# Patient Record
Sex: Female | Born: 1965 | Race: Black or African American | Hispanic: No | Marital: Single | Smoking: Never smoker
Health system: Southern US, Community
[De-identification: ages and names within clinical notes are randomized; demographics above are authoritative.]

## PROBLEM LIST (undated history)

## (undated) DIAGNOSIS — F32A Depression, unspecified: Secondary | ICD-10-CM

## (undated) DIAGNOSIS — M5030 Other cervical disc degeneration, unspecified cervical region: Secondary | ICD-10-CM

## (undated) DIAGNOSIS — G43909 Migraine, unspecified, not intractable, without status migrainosus: Secondary | ICD-10-CM

## (undated) DIAGNOSIS — I499 Cardiac arrhythmia, unspecified: Secondary | ICD-10-CM

## (undated) DIAGNOSIS — G4730 Sleep apnea, unspecified: Secondary | ICD-10-CM

## (undated) DIAGNOSIS — I10 Essential (primary) hypertension: Secondary | ICD-10-CM

## (undated) DIAGNOSIS — F329 Major depressive disorder, single episode, unspecified: Secondary | ICD-10-CM

## (undated) HISTORY — PX: ABDOMINAL HYSTERECTOMY: SHX81

## (undated) HISTORY — PX: VAGINAL HYSTERECTOMY: SHX2639

---

## 2005-04-07 ENCOUNTER — Emergency Department: Payer: Self-pay | Admitting: Emergency Medicine

## 2005-08-17 ENCOUNTER — Emergency Department: Payer: Self-pay | Admitting: Emergency Medicine

## 2006-09-28 ENCOUNTER — Emergency Department: Payer: Self-pay | Admitting: General Practice

## 2007-07-22 ENCOUNTER — Emergency Department: Payer: Self-pay | Admitting: Emergency Medicine

## 2008-01-25 ENCOUNTER — Ambulatory Visit: Payer: Self-pay | Admitting: Family Medicine

## 2009-02-21 ENCOUNTER — Emergency Department: Payer: Self-pay | Admitting: Emergency Medicine

## 2009-07-09 ENCOUNTER — Encounter: Payer: Self-pay | Admitting: Unknown Physician Specialty

## 2009-07-20 ENCOUNTER — Encounter: Payer: Self-pay | Admitting: Unknown Physician Specialty

## 2009-08-20 ENCOUNTER — Encounter: Payer: Self-pay | Admitting: Unknown Physician Specialty

## 2010-01-17 ENCOUNTER — Observation Stay: Payer: Self-pay | Admitting: *Deleted

## 2011-04-29 ENCOUNTER — Ambulatory Visit: Payer: Self-pay

## 2011-07-10 ENCOUNTER — Emergency Department: Payer: Self-pay | Admitting: Internal Medicine

## 2012-03-20 ENCOUNTER — Emergency Department: Payer: Self-pay | Admitting: Emergency Medicine

## 2012-05-24 ENCOUNTER — Emergency Department: Payer: Self-pay | Admitting: Emergency Medicine

## 2012-05-24 LAB — CBC WITH DIFFERENTIAL/PLATELET
Basophil %: 0.7 %
Eosinophil %: 1.3 %
HCT: 35.8 % (ref 35.0–47.0)
Lymphocyte #: 2.8 10*3/uL (ref 1.0–3.6)
Lymphocyte %: 39.3 %
MCV: 81 fL (ref 80–100)
Monocyte #: 0.6 x10 3/mm (ref 0.2–0.9)
Monocyte %: 8.5 %
Platelet: 265 10*3/uL (ref 150–440)
RBC: 4.4 10*6/uL (ref 3.80–5.20)
RDW: 14.9 % — ABNORMAL HIGH (ref 11.5–14.5)
WBC: 7.3 10*3/uL (ref 3.6–11.0)

## 2012-05-24 LAB — URINALYSIS, COMPLETE
Bilirubin,UR: NEGATIVE
Blood: NEGATIVE
Leukocyte Esterase: NEGATIVE
Nitrite: NEGATIVE
Ph: 5 (ref 4.5–8.0)
Protein: NEGATIVE
Specific Gravity: 1.025 (ref 1.003–1.030)
Squamous Epithelial: <1

## 2012-05-24 LAB — COMPREHENSIVE METABOLIC PANEL
Anion Gap: 5 — ABNORMAL LOW (ref 7–16)
BUN: 20 mg/dL — ABNORMAL HIGH (ref 7–18)
Calcium, Total: 8.7 mg/dL (ref 8.5–10.1)
Chloride: 107 mmol/L (ref 98–107)
Co2: 28 mmol/L (ref 21–32)
Creatinine: 0.77 mg/dL (ref 0.60–1.30)
EGFR (African American): >60
EGFR (Non-African Amer.): >60
SGOT(AST): 24 U/L (ref 15–37)
SGPT (ALT): 25 U/L (ref 12–78)
Total Protein: 7.1 g/dL (ref 6.4–8.2)

## 2012-05-24 LAB — TROPONIN I: Troponin-I: <0.02 ng/mL

## 2012-09-12 ENCOUNTER — Emergency Department: Payer: Self-pay | Admitting: Emergency Medicine

## 2012-12-13 ENCOUNTER — Ambulatory Visit: Payer: Self-pay

## 2012-12-13 ENCOUNTER — Emergency Department: Payer: Self-pay | Admitting: Emergency Medicine

## 2012-12-13 LAB — BASIC METABOLIC PANEL
Co2: 27 mmol/L (ref 21–32)
Creatinine: 0.87 mg/dL (ref 0.60–1.30)
EGFR (African American): >60
Glucose: 91 mg/dL (ref 65–99)
Osmolality: 280 (ref 275–301)
Potassium: 3.9 mmol/L (ref 3.5–5.1)
Sodium: 139 mmol/L (ref 136–145)

## 2012-12-13 LAB — CBC
HGB: 11.2 g/dL — ABNORMAL LOW (ref 12.0–16.0)
MCH: 25.9 pg — ABNORMAL LOW (ref 26.0–34.0)
MCV: 83 fL (ref 80–100)
Platelet: 295 10*3/uL (ref 150–440)
RDW: 15.4 % — ABNORMAL HIGH (ref 11.5–14.5)

## 2012-12-13 LAB — CK TOTAL AND CKMB (NOT AT ARMC)
CK, Total: 190 U/L (ref 21–215)
CK-MB: 1.1 ng/mL (ref 0.5–3.6)

## 2013-11-06 ENCOUNTER — Emergency Department: Payer: Self-pay | Admitting: Emergency Medicine

## 2013-11-06 LAB — BASIC METABOLIC PANEL
ANION GAP: 6 — AB (ref 7–16)
BUN: 15 mg/dL (ref 7–18)
Calcium, Total: 9.5 mg/dL (ref 8.5–10.1)
Chloride: 107 mmol/L (ref 98–107)
Co2: 28 mmol/L (ref 21–32)
Creatinine: 0.69 mg/dL (ref 0.60–1.30)
EGFR (African American): >60
EGFR (Non-African Amer.): >60
GLUCOSE: 99 mg/dL (ref 65–99)
Osmolality: 282 (ref 275–301)
Potassium: 3.6 mmol/L (ref 3.5–5.1)
Sodium: 141 mmol/L (ref 136–145)

## 2013-11-06 LAB — CBC
HCT: 37.9 % (ref 35.0–47.0)
HGB: 12.3 g/dL (ref 12.0–16.0)
MCH: 26.9 pg (ref 26.0–34.0)
MCHC: 32.5 g/dL (ref 32.0–36.0)
MCV: 83 fL (ref 80–100)
PLATELETS: 299 10*3/uL (ref 150–440)
RBC: 4.58 10*6/uL (ref 3.80–5.20)
RDW: 14.8 % — ABNORMAL HIGH (ref 11.5–14.5)
WBC: 10.2 10*3/uL (ref 3.6–11.0)

## 2013-11-06 LAB — TROPONIN I

## 2014-01-06 ENCOUNTER — Emergency Department: Payer: Self-pay | Admitting: Emergency Medicine

## 2014-01-06 LAB — URINALYSIS, COMPLETE
BILIRUBIN, UR: NEGATIVE
GLUCOSE, UR: NEGATIVE mg/dL (ref 0–75)
KETONE: NEGATIVE
NITRITE: NEGATIVE
PH: 5 (ref 4.5–8.0)
Protein: NEGATIVE
RBC,UR: 33 /HPF (ref 0–5)
Specific Gravity: 1.023 (ref 1.003–1.030)
Squamous Epithelial: 6
WBC UR: 31 /HPF (ref 0–5)

## 2014-01-06 LAB — CBC
HCT: 36.9 % (ref 35.0–47.0)
HGB: 12.1 g/dL (ref 12.0–16.0)
MCH: 27.1 pg (ref 26.0–34.0)
MCHC: 32.8 g/dL (ref 32.0–36.0)
MCV: 83 fL (ref 80–100)
PLATELETS: 271 10*3/uL (ref 150–440)
RBC: 4.47 10*6/uL (ref 3.80–5.20)
RDW: 14.6 % — AB (ref 11.5–14.5)
WBC: 8.3 10*3/uL (ref 3.6–11.0)

## 2014-01-06 LAB — COMPREHENSIVE METABOLIC PANEL
ALK PHOS: 58 U/L
ALT: 24 U/L (ref 12–78)
AST: 20 U/L (ref 15–37)
Albumin: 3.7 g/dL (ref 3.4–5.0)
Anion Gap: 3 — ABNORMAL LOW (ref 7–16)
BUN: 21 mg/dL — AB (ref 7–18)
Bilirubin,Total: 0.2 mg/dL (ref 0.2–1.0)
CHLORIDE: 106 mmol/L (ref 98–107)
CREATININE: 0.73 mg/dL (ref 0.60–1.30)
Calcium, Total: 9.1 mg/dL (ref 8.5–10.1)
Co2: 29 mmol/L (ref 21–32)
EGFR (Non-African Amer.): >60
Glucose: 77 mg/dL (ref 65–99)
OSMOLALITY: 277 (ref 275–301)
Potassium: 4 mmol/L (ref 3.5–5.1)
SODIUM: 138 mmol/L (ref 136–145)
Total Protein: 7.5 g/dL (ref 6.4–8.2)

## 2014-01-06 LAB — GC/CHLAMYDIA PROBE AMP

## 2014-01-06 LAB — WET PREP, GENITAL

## 2014-01-08 LAB — URINE CULTURE

## 2014-03-15 ENCOUNTER — Emergency Department: Payer: Self-pay | Admitting: Emergency Medicine

## 2014-04-10 ENCOUNTER — Emergency Department: Payer: Self-pay | Admitting: Emergency Medicine

## 2014-04-10 LAB — COMPREHENSIVE METABOLIC PANEL
ALT: 41 U/L (ref 12–78)
Albumin: 3.3 g/dL — ABNORMAL LOW (ref 3.4–5.0)
Alkaline Phosphatase: 55 U/L
Anion Gap: 8 (ref 7–16)
BUN: 13 mg/dL (ref 7–18)
Bilirubin,Total: 0.4 mg/dL (ref 0.2–1.0)
CHLORIDE: 103 mmol/L (ref 98–107)
Calcium, Total: 9.1 mg/dL (ref 8.5–10.1)
Co2: 28 mmol/L (ref 21–32)
Creatinine: 0.89 mg/dL (ref 0.60–1.30)
Glucose: 159 mg/dL — ABNORMAL HIGH (ref 65–99)
Osmolality: 281 (ref 275–301)
POTASSIUM: 3.6 mmol/L (ref 3.5–5.1)
SGOT(AST): 23 U/L (ref 15–37)
SODIUM: 139 mmol/L (ref 136–145)
TOTAL PROTEIN: 6.7 g/dL (ref 6.4–8.2)

## 2014-04-10 LAB — CBC WITH DIFFERENTIAL/PLATELET
BASOS ABS: 0 10*3/uL (ref 0.0–0.1)
Basophil %: 0.4 %
EOS PCT: 0.9 %
Eosinophil #: 0.1 10*3/uL (ref 0.0–0.7)
HCT: 39.7 % (ref 35.0–47.0)
HGB: 12.6 g/dL (ref 12.0–16.0)
LYMPHS PCT: 32.5 %
Lymphocyte #: 1.9 10*3/uL (ref 1.0–3.6)
MCH: 25.8 pg — ABNORMAL LOW (ref 26.0–34.0)
MCHC: 31.7 g/dL — AB (ref 32.0–36.0)
MCV: 82 fL (ref 80–100)
Monocyte #: 0.7 x10 3/mm (ref 0.2–0.9)
Monocyte %: 11.7 %
Neutrophil #: 3.3 10*3/uL (ref 1.4–6.5)
Neutrophil %: 54.5 %
Platelet: 236 10*3/uL (ref 150–440)
RBC: 4.87 10*6/uL (ref 3.80–5.20)
RDW: 14.1 % (ref 11.5–14.5)
WBC: 6 10*3/uL (ref 3.6–11.0)

## 2014-04-10 LAB — LIPASE, BLOOD: Lipase: 111 U/L (ref 73–393)

## 2014-04-10 LAB — TROPONIN I: Troponin-I: <0.02 ng/mL

## 2014-04-15 ENCOUNTER — Emergency Department: Payer: Self-pay | Admitting: Emergency Medicine

## 2014-04-15 LAB — CBC
HCT: 37.1 % (ref 35.0–47.0)
HGB: 12 g/dL (ref 12.0–16.0)
MCH: 25.9 pg — ABNORMAL LOW (ref 26.0–34.0)
MCHC: 32.3 g/dL (ref 32.0–36.0)
MCV: 80 fL (ref 80–100)
PLATELETS: 215 10*3/uL (ref 150–440)
RBC: 4.62 10*6/uL (ref 3.80–5.20)
RDW: 14.2 % (ref 11.5–14.5)
WBC: 6.7 10*3/uL (ref 3.6–11.0)

## 2014-04-15 LAB — COMPREHENSIVE METABOLIC PANEL
ANION GAP: 6 — AB (ref 7–16)
AST: 48 U/L — AB (ref 15–37)
Albumin: 3.3 g/dL — ABNORMAL LOW (ref 3.4–5.0)
Alkaline Phosphatase: 60 U/L
BUN: 9 mg/dL (ref 7–18)
Bilirubin,Total: 0.3 mg/dL (ref 0.2–1.0)
Calcium, Total: 8.7 mg/dL (ref 8.5–10.1)
Chloride: 107 mmol/L (ref 98–107)
Co2: 26 mmol/L (ref 21–32)
Creatinine: 0.86 mg/dL (ref 0.60–1.30)
EGFR (African American): >60
EGFR (Non-African Amer.): >60
Glucose: 99 mg/dL (ref 65–99)
OSMOLALITY: 276 (ref 275–301)
POTASSIUM: 3.7 mmol/L (ref 3.5–5.1)
SGPT (ALT): 68 U/L (ref 12–78)
SODIUM: 139 mmol/L (ref 136–145)
Total Protein: 6.6 g/dL (ref 6.4–8.2)

## 2014-04-15 LAB — URINALYSIS, COMPLETE
BLOOD: NEGATIVE
Bacteria: NONE SEEN
Bilirubin,UR: NEGATIVE
Glucose,UR: NEGATIVE mg/dL (ref 0–75)
Ketone: NEGATIVE
LEUKOCYTE ESTERASE: NEGATIVE
NITRITE: NEGATIVE
PROTEIN: NEGATIVE
Ph: 7 (ref 4.5–8.0)
RBC,UR: 1 /HPF (ref 0–5)
Specific Gravity: 1.014 (ref 1.003–1.030)
Squamous Epithelial: <1
WBC UR: <1 /HPF (ref 0–5)

## 2014-04-15 LAB — TROPONIN I: Troponin-I: <0.02 ng/mL

## 2014-04-15 LAB — LIPASE, BLOOD: Lipase: 111 U/L (ref 73–393)

## 2014-10-26 ENCOUNTER — Emergency Department: Payer: Self-pay | Admitting: Emergency Medicine

## 2014-12-28 ENCOUNTER — Emergency Department: Payer: Self-pay | Admitting: Emergency Medicine

## 2015-03-27 ENCOUNTER — Emergency Department
Admission: EM | Admit: 2015-03-27 | Discharge: 2015-03-28 | Disposition: A | Discharge status: Home / Self Care | Payer: Managed Care, Other (non HMO) | Attending: Emergency Medicine | Admitting: Emergency Medicine

## 2015-03-27 DIAGNOSIS — Z79899 Other long term (current) drug therapy: Secondary | ICD-10-CM | POA: Insufficient documentation

## 2015-03-27 DIAGNOSIS — F4323 Adjustment disorder with mixed anxiety and depressed mood: Secondary | ICD-10-CM

## 2015-03-27 DIAGNOSIS — F329 Major depressive disorder, single episode, unspecified: Secondary | ICD-10-CM | POA: Insufficient documentation

## 2015-03-27 DIAGNOSIS — F439 Reaction to severe stress, unspecified: Secondary | ICD-10-CM | POA: Diagnosis present

## 2015-03-27 DIAGNOSIS — F4329 Adjustment disorder with other symptoms: Secondary | ICD-10-CM | POA: Insufficient documentation

## 2015-03-27 DIAGNOSIS — F330 Major depressive disorder, recurrent, mild: Secondary | ICD-10-CM

## 2015-03-27 DIAGNOSIS — M1990 Unspecified osteoarthritis, unspecified site: Secondary | ICD-10-CM

## 2015-03-27 DIAGNOSIS — F419 Anxiety disorder, unspecified: Secondary | ICD-10-CM | POA: Diagnosis not present

## 2015-03-27 DIAGNOSIS — I10 Essential (primary) hypertension: Secondary | ICD-10-CM | POA: Diagnosis not present

## 2015-03-27 DIAGNOSIS — F32A Depression, unspecified: Secondary | ICD-10-CM

## 2015-03-27 HISTORY — DX: Migraine, unspecified, not intractable, without status migrainosus: G43.909

## 2015-03-27 HISTORY — DX: Essential (primary) hypertension: I10

## 2015-03-27 HISTORY — DX: Depression, unspecified: F32.A

## 2015-03-27 HISTORY — DX: Major depressive disorder, single episode, unspecified: F32.9

## 2015-03-27 HISTORY — DX: Other cervical disc degeneration, unspecified cervical region: M50.30

## 2015-03-27 HISTORY — DX: Sleep apnea, unspecified: G47.30

## 2015-03-27 LAB — URINALYSIS COMPLETE WITH MICROSCOPIC (ARMC ONLY)
BILIRUBIN URINE: NEGATIVE
Bacteria, UA: NONE SEEN
Glucose, UA: NEGATIVE mg/dL
Hgb urine dipstick: NEGATIVE
Ketones, ur: NEGATIVE mg/dL
Leukocytes, UA: NEGATIVE
Nitrite: NEGATIVE
Protein, ur: NEGATIVE mg/dL
Specific Gravity, Urine: 1.019 (ref 1.005–1.030)
pH: 6 (ref 5.0–8.0)

## 2015-03-27 LAB — ETHANOL: Alcohol, Ethyl (B): <5 mg/dL (ref <5)

## 2015-03-27 LAB — URINE DRUG SCREEN, QUALITATIVE (ARMC ONLY)
Amphetamines, Ur Screen: NOT DETECTED
Barbiturates, Ur Screen: NOT DETECTED
Benzodiazepine, Ur Scrn: NOT DETECTED
CANNABINOID 50 NG, UR ~~LOC~~: NOT DETECTED
Cocaine Metabolite,Ur ~~LOC~~: NOT DETECTED
MDMA (Ecstasy)Ur Screen: NOT DETECTED
METHADONE SCREEN, URINE: NOT DETECTED
OPIATE, UR SCREEN: NOT DETECTED
PHENCYCLIDINE (PCP) UR S: NOT DETECTED
Tricyclic, Ur Screen: NOT DETECTED

## 2015-03-27 LAB — COMPREHENSIVE METABOLIC PANEL
ALK PHOS: 51 U/L (ref 38–126)
ALT: 19 U/L (ref 14–54)
ANION GAP: 6 (ref 5–15)
AST: 19 U/L (ref 15–41)
Albumin: 4.1 g/dL (ref 3.5–5.0)
BUN: 13 mg/dL (ref 6–20)
CALCIUM: 8.8 mg/dL — AB (ref 8.9–10.3)
CO2: 29 mmol/L (ref 22–32)
Chloride: 107 mmol/L (ref 101–111)
Creatinine, Ser: 0.79 mg/dL (ref 0.44–1.00)
GFR calc non Af Amer: >60 mL/min (ref >60)
Glucose, Bld: 96 mg/dL (ref 65–99)
Potassium: 3.5 mmol/L (ref 3.5–5.1)
SODIUM: 142 mmol/L (ref 135–145)
Total Bilirubin: 0.4 mg/dL (ref 0.3–1.2)
Total Protein: 7.3 g/dL (ref 6.5–8.1)

## 2015-03-27 LAB — CBC WITH DIFFERENTIAL/PLATELET
Basophils Absolute: 0 10*3/uL (ref 0–0.1)
Basophils Relative: 1 %
EOS PCT: 2 %
Eosinophils Absolute: 0.1 10*3/uL (ref 0–0.7)
HCT: 38.6 % (ref 35.0–47.0)
Hemoglobin: 12.3 g/dL (ref 12.0–16.0)
Lymphocytes Relative: 41 %
Lymphs Abs: 3.6 10*3/uL (ref 1.0–3.6)
MCH: 26.2 pg (ref 26.0–34.0)
MCHC: 31.8 g/dL — AB (ref 32.0–36.0)
MCV: 82.3 fL (ref 80.0–100.0)
Monocytes Absolute: 0.7 10*3/uL (ref 0.2–0.9)
Monocytes Relative: 8 %
NEUTROS ABS: 4.3 10*3/uL (ref 1.4–6.5)
NEUTROS PCT: 48 %
Platelets: 267 10*3/uL (ref 150–440)
RBC: 4.69 MIL/uL (ref 3.80–5.20)
RDW: 14.2 % (ref 11.5–14.5)
WBC: 8.7 10*3/uL (ref 3.6–11.0)

## 2015-03-27 LAB — SALICYLATE LEVEL: Salicylate Lvl: <4 mg/dL (ref 2.8–30.0)

## 2015-03-27 LAB — ACETAMINOPHEN LEVEL: Acetaminophen (Tylenol), Serum: <10 ug/mL — ABNORMAL LOW (ref 10–30)

## 2015-03-27 NOTE — ED Notes (Signed)
BEHAVIORAL HEALTH ROUNDING Patient sleeping: No. Patient alert and oriented: yes Behavior appropriate: Yes.  ; If no, describe:  Nutrition and fluids offered: Yes  Toileting and hygiene offered: Yes  Sitter present: yes Law enforcement present: Yes  

## 2015-03-27 NOTE — ED Notes (Signed)

## 2015-03-27 NOTE — ED Notes (Addendum)

## 2015-03-27 NOTE — ED Notes (Addendum)
BEHAVIORAL HEALTH ROUNDING Patient sleeping: No. Patient alert and oriented: yes Behavior appropriate: Yes.  ;  Nutrition and fluids offered: Yes  Toileting and hygiene offered: Yes  Sitter present: yes Law enforcement present: Yes  

## 2015-03-27 NOTE — ED Notes (Signed)
BEHAVIORAL HEALTH ROUNDING Patient sleeping: Yes.   Patient alert and oriented: yes Behavior appropriate: Yes.  ; If no, describe:  Nutrition and fluids offered: Yes  Toileting and hygiene offered: Yes  Sitter present: yes Law enforcement present: Yes  

## 2015-03-27 NOTE — ED Notes (Signed)
Patient states family issues are extremely stressful and needs help dealing with it.
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
## 2015-03-27 NOTE — ED Notes (Signed)
Patient has been changed out and belongings have been secured. ED MD has seen patient. Awaiting psych consult for further evaluation and placement.

## 2015-03-28 DIAGNOSIS — F4323 Adjustment disorder with mixed anxiety and depressed mood: Secondary | ICD-10-CM

## 2015-03-28 DIAGNOSIS — I10 Essential (primary) hypertension: Secondary | ICD-10-CM

## 2015-03-28 DIAGNOSIS — M1990 Unspecified osteoarthritis, unspecified site: Secondary | ICD-10-CM

## 2015-03-28 DIAGNOSIS — F330 Major depressive disorder, recurrent, mild: Secondary | ICD-10-CM

## 2015-03-28 NOTE — ED Notes (Signed)

## 2015-03-28 NOTE — Discharge Instructions (Signed)
Adjustment Disorder °Most changes in life can cause stress. Getting used to changes may take a few months or longer. If feelings of stress, hopelessness, or worry continue, you may have an adjustment disorder. This stress-related mental health problem may affect your feelings, thinking and how you act. It occurs in both sexes and happens at any age. °SYMPTOMS  °Some of the following problems may be seen and vary from person to person: °· Sadness or depression. °· Loss of enjoyment. °· Thoughts of suicide. °· Fighting. °· Avoiding family and friends. °· Poor school performance. °· Hopelessness, sense of loss. °· Trouble sleeping. °· Vandalism. °· Worry, weight loss or gain. °· Crying spells. °· Anxiety °· Reckless driving. °· Skipping school. °· Poor work performance. °· Nervousness. °· Ignoring bills. °· Poor attitude. °DIAGNOSIS  °Your caregiver will ask what has happened in your life and do a physical exam. They will make a diagnosis of an adjustment disorder when they are sure another problem or medical illness causing your feelings does not exist. °TREATMENT  °When problems caused by stress interfere with you daily life or last longer than a few months, you may need counseling for an adjustment disorder. Early treatment may diminish problems and help you to better cope with the stressful events in your life. Sometimes medication is necessary. Individual counseling and or support groups can be very helpful. °PROGNOSIS  °Adjustment disorders usually last less than 3 to 6 months. The condition may persist if there is long lasting stress. This could include health problems, relationship problems, or job difficulties where you can not easily escape from what is causing the problem. °PREVENTION  °Even the most mentally healthy, highly functioning people can suffer from an adjustment disorder given a significant blow from a life-changing event. There is no way to prevent pain and loss. Most people need help from time  to time. You are not alone. °SEEK MEDICAL CARE IF:  °Your feelings or symptoms listed above do not improve or worsen. °Document Released: 06/10/2006 Document Revised: 12/29/2011 Document Reviewed: 09/01/2007 °ExitCare® Patient Information ©2015 [employer]. This information is not intended to replace advice given to you by your health care provider. Make sure you discuss any questions you have with your health care provider. ° °

## 2015-03-28 NOTE — ED Provider Notes (Signed)
-----------------------------------------   8:07 AM on 03/28/2015 -----------------------------------------   BP 125/81 mmHg  Pulse 74  Temp(Src) 97.6 F (36.4 C) (Oral)  Ht 5\' 7"  (1.702 m)  Wt 260 lb (117.935 kg)  BMI 40.71 kg/m2  SpO2 96%  The patient had no acute events since last update.  Calm and cooperative at this time.  Disposition is pending per Psychiatry/Behavioral Medicine team recommendations.     Myrna Blazeravid Matthew Yatzary Merriweather, MD 03/28/15 [phone]

## 2015-03-28 NOTE — [facility] Counselor (Signed)
Per request of Psych MD (Dr. Toni Amendlapacs), writer provided the pt. with information and instructions on how to access Out Pt. Mental Health Treatment [facility]).

## 2015-03-28 NOTE — ED Notes (Signed)
BEHAVIORAL HEALTH ROUNDING Patient sleeping: Yes.   Patient alert and oriented: not applicable Behavior appropriate: Yes.    Nutrition and fluids offered: No Toileting and hygiene offered: No Sitter present: q15 minute observations and security camera monitoring Law enforcement present: Yes [employer] 

## 2015-03-28 NOTE — ED Notes (Signed)
Patient assigned to appropriate care area. Patient oriented to unit/care area: Informed that, for their safety, care areas are designed for safety and monitored by security cameras at all times; and visiting hours explained to patient. Patient verbalizes understanding, and verbal contract for safety obtained.  Pt brought into ED BHU via sally port and wanded with metal detector for safety by [employer] officer. Patient oriented to unit/care area: Pt informed of unit policies and procedures.  Informed that, for their safety, care areas are designed for safety and monitored by security cameras at all times; and visiting hours explained to patient. Patient verbalizes understanding, and verbal contract for safety obtained.Pt shown to their room.   BEHAVIORAL HEALTH ROUNDING Patient sleeping: No. Patient alert and oriented: yes Behavior appropriate: Yes.   Nutrition and fluids offered: Yes  Toileting and hygiene offered: Yes  Sitter present: q15 min observations and security camera monitoring Law enforcement present: Yes [employer]  ENVIRONMENTAL ASSESSMENT Potentially harmful objects out of patient reach: Yes.   Personal belongings secured: Yes.   Patient dressed in hospital provided attire only: Yes.   Plastic bags out of patient reach: Yes.   Patient care equipment (cords, cables, call bells, lines, and drains) shortened, removed, or accounted for: Yes.    Potentially toxic materials out of patient reach: Yes.   Sharps container removed or out of patient reach: Yes.
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
## 2015-03-28 NOTE — ED Notes (Signed)
BEHAVIORAL HEALTH ROUNDING Patient sleeping: Yes.   Patient alert and oriented: not applicable Behavior appropriate: Yes.  ; If no, describe:  Nutrition and fluids offered: Yes  Toileting and hygiene offered: Yes  Sitter present: no Law enforcement present: Yes  

## 2015-03-28 NOTE — ED Provider Notes (Signed)
-----------------------------------------   3:52 PM on 03/28/2015 -----------------------------------------  Discussed with Dr. Delaney Meigslaypacs who has evaluated the patient.Patient has adjustment disorder. Patient is voluntarily here, we'll discharge home with multiple resources so that she can find a private mental health provider. DC home. She is comfortable with that plan.  Gayla DossEryka A Santa Abdelrahman, MD 03/28/15 [phone]

## 2015-12-10 ENCOUNTER — Encounter (HOSPITAL_COMMUNITY): Payer: Self-pay | Admitting: *Deleted

## 2015-12-10 ENCOUNTER — Emergency Department (HOSPITAL_COMMUNITY)
Admission: EM | Admit: 2015-12-10 | Discharge: 2015-12-10 | Disposition: A | Discharge status: Home / Self Care | Payer: Managed Care, Other (non HMO) | Attending: Emergency Medicine | Admitting: Emergency Medicine

## 2015-12-10 ENCOUNTER — Emergency Department (HOSPITAL_COMMUNITY): Payer: Managed Care, Other (non HMO)

## 2015-12-10 DIAGNOSIS — Z7984 Long term (current) use of oral hypoglycemic drugs: Secondary | ICD-10-CM | POA: Insufficient documentation

## 2015-12-10 DIAGNOSIS — Z79899 Other long term (current) drug therapy: Secondary | ICD-10-CM | POA: Insufficient documentation

## 2015-12-10 DIAGNOSIS — I10 Essential (primary) hypertension: Secondary | ICD-10-CM | POA: Diagnosis not present

## 2015-12-10 DIAGNOSIS — M545 Low back pain: Secondary | ICD-10-CM | POA: Diagnosis present

## 2015-12-10 DIAGNOSIS — M5416 Radiculopathy, lumbar region: Secondary | ICD-10-CM | POA: Diagnosis not present

## 2015-12-10 DIAGNOSIS — G43909 Migraine, unspecified, not intractable, without status migrainosus: Secondary | ICD-10-CM | POA: Diagnosis not present

## 2015-12-10 DIAGNOSIS — F329 Major depressive disorder, single episode, unspecified: Secondary | ICD-10-CM | POA: Insufficient documentation

## 2015-12-10 DIAGNOSIS — R3915 Urgency of urination: Secondary | ICD-10-CM | POA: Diagnosis not present

## 2015-12-10 LAB — URINALYSIS, ROUTINE W REFLEX MICROSCOPIC
Bilirubin Urine: NEGATIVE
Glucose, UA: NEGATIVE mg/dL
HGB URINE DIPSTICK: NEGATIVE
Ketones, ur: NEGATIVE mg/dL
Leukocytes, UA: NEGATIVE
Nitrite: NEGATIVE
Protein, ur: NEGATIVE mg/dL
SPECIFIC GRAVITY, URINE: 1.015 (ref 1.005–1.030)
pH: 7.5 (ref 5.0–8.0)

## 2015-12-10 MED ORDER — IBUPROFEN 600 MG PO TABS
600.0000 mg | ORAL_TABLET | Freq: Four times a day (QID) | ORAL | Status: DC | PRN
Start: 2015-12-10 — End: 2016-03-07

## 2015-12-10 MED ORDER — IBUPROFEN 800 MG PO TABS
800.0000 mg | ORAL_TABLET | Freq: Once | ORAL | Status: AC
  Administered 2015-12-10: 800 mg via ORAL
  Filled 2015-12-10: qty 1

## 2015-12-10 MED ORDER — METHOCARBAMOL 500 MG PO TABS: 1000.0000 mg | ORAL_TABLET | Freq: Four times a day (QID) | ORAL | Status: AC

## 2015-12-10 MED ORDER — HYDROCODONE-ACETAMINOPHEN 5-325 MG PO TABS: 1.0000 | ORAL_TABLET | ORAL | Status: DC | PRN

## 2015-12-10 NOTE — Discharge Instructions (Signed)
Lumbosacral Radiculopathy Lumbosacral radiculopathy is a condition that involves the spinal nerves and nerve roots in the low back and bottom of the spine. The condition develops when these nerves and nerve roots move out of place or become inflamed and cause symptoms. CAUSES This condition may be caused by:  Pressure from a disk that bulges out of place (herniated disk). A disk is a plate of cartilage that separates bones in the spine.  Disk degeneration.  A narrowing of the bones of the lower back (spinal stenosis).  A tumor.  An infection.  An injury that places sudden pressure on the disks that cushion the bones of your lower spine. RISK FACTORS This condition is more likely to develop in:  Males aged 30-50 years.  Females aged 50-60 years.  People who lift improperly.  People who are overweight or live a sedentary lifestyle.  People who smoke.  People who perform repetitive activities that strain the spine. SYMPTOMS Symptoms of this condition include:  Pain that goes down from the back into the legs (sciatica). This is the most common symptom. The pain may be worse with sitting, coughing, or sneezing.  Pain and numbness in the arms and legs.  Muscle weakness.  Tingling.  Loss of bladder control or bowel control. DIAGNOSIS This condition is diagnosed with a physical exam and medical history. If the pain is lasting, you may have tests, such as:  MRI scan.  X-ray.  CT scan.  Myelogram.  Nerve conduction study. TREATMENT This condition is often treated with:  Hot packs and ice applied to affected areas.  Stretches to improve flexibility.  Exercises to strengthen back muscles.  Physical therapy.  Pain medicine.  A steroid injection in the spine. In some cases, no treatment is needed. If the condition is long-lasting (chronic), or if symptoms are severe, treatment may involve surgery or lifestyle changes, such as following a weight loss plan. HOME  CARE INSTRUCTIONS Medicines  Take medicines only as directed by your health care provider.  Do not drive or operate heavy machinery while taking pain medicine. Injury Care  Apply a heat pack to the injured area as directed by your health care provider.  Apply ice to the affected area:  Put ice in a plastic bag.  Place a towel between your skin and the bag.  Leave the ice on for 20-30 minutes, every 2 hours while you are awake or as needed. Or, leave the ice on for as long as directed by your health care provider. Other Instructions  If you were shown how to do any exercises or stretches, do them as directed by your health care provider.  If your health care provider prescribed a diet or exercise program, follow it as directed.  Keep all follow-up visits as directed by your health care provider. This is important. SEEK MEDICAL CARE IF:  Your pain does not improve over time even when taking pain medicines. SEEK IMMEDIATE MEDICAL CARE IF:  Your develop severe pain.  Your pain suddenly gets worse.  You develop increasing weakness in your legs.  You lose the ability to control your bladder or bowel.  You have difficulty walking or balancing.  You have a fever.   This information is not intended to replace advice given to you by your health care provider. Make sure you discuss any questions you have with your health care provider.   Document Released: 10/06/2005 Document Revised: 02/20/2015 Document Reviewed: 10/02/2014 Elsevier Interactive Patient Education [employer]  Do not drive within 4 hours of taking hydrocodone as this will make you drowsy.  Avoid lifting,  Bending,  Twisting or any other activity that worsens your pain over the next week.  Apply a heating pad to your back for 20 minutes three times daily for the next week.  You should get rechecked if your symptoms are not better over the next 5 days,  Or you develop increased pain,  Weakness in your  leg(s) or loss of bladder or bowel function - these are symptoms of a worse injury.

## 2015-12-10 NOTE — ED Provider Notes (Signed)
CSN: [id number]     Arrival date & time 12/10/15  8413 History  By signing my name below, I, Evon Slack, attest that this documentation has been prepared under the direction and in the presence of Burgess Amor, PA-C. Electronically Signed: Evon Slack, ED Scribe. 12/10/2015. 11:21 AM.     Chief Complaint  Patient presents with  . Back Pain   Patient is a 50 y.o. female presenting with back pain. The history is provided by the patient. No language interpreter was used.  Back Pain Associated symptoms: no dysuria, no numbness and no weakness    HPI Comments: Sheena Cain is a 50 y.o. female who presents to the Emergency Department complaining of chronic back pain that has recently worsened in the last 3 weeks. Pt states she has a Hx of bulging disc in her back. She states that this pain feels like previous back pain but is now radiating down her right leg in addition to the left (chronic) to the level of her knees. Pt denies any medications PTA. Pt denies injury or trauma to the back. She does report urinary urgency. Pt denies numbness, weakness, bowel/bladder incontinence, dysuria or hematuria.   Past Medical History  Diagnosis Date  . Depression   . Hypertension   . Degenerative disc disease, cervical   . Sleep apnea   . Migraines    Past Surgical History  Procedure Laterality Date  . Abdominal hysterectomy     No family history on file. Social History  Substance Use Topics  . Smoking status: Never Smoker   . Smokeless tobacco: Never Used  . Alcohol Use: No   OB History    No data available      Review of Systems  Genitourinary: Positive for urgency. Negative for dysuria and hematuria.  Musculoskeletal: Positive for back pain.  Neurological: Negative for weakness and numbness.     Allergies  Review of patient's allergies indicates no known allergies.  Home Medications   Prior to Admission medications   Medication Sig Start Date End Date Taking? Authorizing  Provider  Chlorphen-Phenyleph-ASA (ALKA-SELTZER PLUS COLD PO) Take 1 tablet by mouth 2 (two) times daily as needed (for cold symptoms).    Historical Provider, MD  gabapentin (NEURONTIN) 600 MG tablet Take 600 mg by mouth 3 (three) times daily.    Historical Provider, MD  HYDROcodone-acetaminophen (NORCO/VICODIN) 5-325 MG tablet Take 1 tablet by mouth every 4 (four) hours as needed. 12/10/15   Burgess Amor, PA-C  ibuprofen (ADVIL,MOTRIN) 600 MG tablet Take 1 tablet (600 mg total) by mouth every 6 (six) hours as needed. 12/10/15   Burgess Amor, PA-C  metFORMIN (GLUCOPHAGE) 500 MG tablet Take 500 mg by mouth daily.     Historical Provider, MD  methocarbamol (ROBAXIN) 500 MG tablet Take 2 tablets (1,000 mg total) by mouth 4 (four) times daily. 12/10/15 12/20/15  Burgess Amor, PA-C  omeprazole (PRILOSEC OTC) 20 MG tablet Take 20 mg by mouth daily.    Historical Provider, MD  traZODone (DESYREL) 50 MG tablet Take 50 mg by mouth at bedtime.    Historical Provider, MD  verapamil (VERELAN PM) 240 MG 24 hr capsule Take 240 mg by mouth at bedtime.    Historical Provider, MD   BP 143/99 mmHg  Pulse 69  Temp(Src) 98.4 F (36.9 C) (Oral)  Resp 16  Ht  (1.676 m)  Wt 114.76 kg  BMI 40.85 kg/m2  SpO2 99%   Physical Exam  Constitutional: She appears well-developed and  well-nourished.  HENT:  Head: Normocephalic.  Eyes: Conjunctivae are normal.  Neck: Normal range of motion. Neck supple.  Cardiovascular: Normal rate and intact distal pulses.   Pedal pulses normal.  Pulmonary/Chest: Effort normal.  Abdominal: Soft. Bowel sounds are normal. She exhibits no distension and no mass.  Musculoskeletal: Normal range of motion. She exhibits no edema.       Lumbar back: She exhibits tenderness. She exhibits no swelling, no edema and no spasm.  Neurological: She is alert. She has normal strength. She displays no atrophy and no tremor. No sensory deficit. Gait normal.  Reflex Scores:      Patellar reflexes are 2+ on  the right side and 2+ on the left side.      Achilles reflexes are 2+ on the right side and 2+ on the left side. No strength deficit noted in hip and knee flexor and extensor muscle groups.  Ankle flexion and extension intact.  Skin: Skin is warm and dry.  Psychiatric: She has a normal mood and affect.  Nursing note and vitals reviewed.   ED Course  Procedures (including critical care time) DIAGNOSTIC STUDIES: Oxygen Saturation is 99% on RA, normal by my interpretation.    COORDINATION OF CARE: 11:21 AM-Discussed treatment plan with pt at bedside and pt agreed to plan.     Labs Review Labs Reviewed  URINALYSIS, ROUTINE W REFLEX MICROSCOPIC (NOT AT [facility]) - Abnormal; Notable for the following:    APPearance CLOUDY (*)    All other components within normal limits    Imaging Review Dg Lumbar Spine Complete  12/10/2015  CLINICAL DATA:  Low back and bilateral leg pain.  No known injury. EXAM: LUMBAR SPINE - COMPLETE 4+ VIEW COMPARISON:  None. FINDINGS: Five non-rib-bearing lumbar vertebrae. Mild anterior and lateral spur formation at multiple levels. Lower lumbar spine facet degenerative changes. No fractures, pars defects or subluxations. IMPRESSION: Degenerative changes Electronically Signed   By: Beckie Salts M.D.   On: 12/10/2015 12:26      EKG Interpretation None      MDM   Final diagnoses:  Lumbar radiculopathy, acute   No neuro deficit on exam or by history to suggest emergent or surgical presentation.  Discussed worsened sx that should prompt immediate re-evaluation including distal weakness, bowel/bladder retention/incontinence.    Pt used to be seen in [place], desires closer provider.  Referrals given.  She was placed on short course of hydrocodone, ibu and robaxin.  Heat tx, activity as tolerated.  [place] controlled substance database reviewed.   I personally performed the services described in this documentation, which was scribed in my presence. The recorded  information has been reviewed and is accurate.     Burgess Amor, PA-C 12/12/15 1610  Glynn Octave, MD 12/12/15 [phone]

## 2015-12-10 NOTE — ED Notes (Signed)
Mid-lower back pain with hx of disc problem, per pt. Pt states worsening pain x 2 days. States Ibuprofen  and "something for sciatic never pain" but it didn't work. Last seen 2 years ago.

## 2016-03-06 ENCOUNTER — Emergency Department (HOSPITAL_COMMUNITY)
Admission: EM | Admit: 2016-03-06 | Discharge: 2016-03-07 | Disposition: A | Discharge status: Home / Self Care | Payer: Managed Care, Other (non HMO) | Attending: Emergency Medicine | Admitting: Emergency Medicine

## 2016-03-06 ENCOUNTER — Encounter (HOSPITAL_COMMUNITY): Payer: Self-pay

## 2016-03-06 DIAGNOSIS — F329 Major depressive disorder, single episode, unspecified: Secondary | ICD-10-CM | POA: Insufficient documentation

## 2016-03-06 DIAGNOSIS — M545 Low back pain, unspecified: Secondary | ICD-10-CM

## 2016-03-06 DIAGNOSIS — A5901 Trichomonal vulvovaginitis: Secondary | ICD-10-CM | POA: Diagnosis not present

## 2016-03-06 DIAGNOSIS — G43909 Migraine, unspecified, not intractable, without status migrainosus: Secondary | ICD-10-CM | POA: Diagnosis not present

## 2016-03-06 DIAGNOSIS — R110 Nausea: Secondary | ICD-10-CM | POA: Diagnosis present

## 2016-03-06 DIAGNOSIS — I10 Essential (primary) hypertension: Secondary | ICD-10-CM | POA: Insufficient documentation

## 2016-03-06 DIAGNOSIS — G43009 Migraine without aura, not intractable, without status migrainosus: Secondary | ICD-10-CM

## 2016-03-06 LAB — URINALYSIS, ROUTINE W REFLEX MICROSCOPIC
Bilirubin Urine: NEGATIVE
GLUCOSE, UA: NEGATIVE mg/dL
KETONES UR: NEGATIVE mg/dL
NITRITE: NEGATIVE
PH: 5.5 (ref 5.0–8.0)
Protein, ur: NEGATIVE mg/dL
Specific Gravity, Urine: 1.025 (ref 1.005–1.030)

## 2016-03-06 LAB — URINE MICROSCOPIC-ADD ON

## 2016-03-06 NOTE — ED Notes (Signed)
Pt reports an intermittent dull HA across forehead with nausea and no vomiting. Also, reports lower back pain that radiates to right side for the past 2 weeks. Reports blood on tissue after voiding and a brown odorous vaginal discharge noticed

## 2016-03-06 NOTE — ED Provider Notes (Signed)
TIME SEEN: 12:00 AM  CHIEF COMPLAINT: Multiple complaints  HPI: Pt is a 50 y.o. female with history of hypertension, migraine headaches, depression, lumbar herniated disks presents emergency department multiple different complaints. Patient states the main reason she came to the emergency department was because she was having nausea for the past 3 days. No vomiting. Did begin having diarrhea today. States she is having some lower suprapubic abdominal pressure. No dysuria but did notice hematuria today when she was wiping. No history of kidney stone.  Also reports that she has had a brown vaginal discharge with odor for the past week. She is sexually active and has had Trichomonas in the past. She is status post hysterectomy.   Patient also reports 2 weeks of lower back pain. Pain is worse with movement and better with rest. Denies numbness, tingling or focal weakness. No bowel or bladder incontinence. No urinary retention. No new injury to her back. Has not tried taking any medications at home for this pain.   Patient also complaining of frontal headache that she describes as a throbbing for the past 3 days. Headache is worse with smells. Similar to her prior chronic headaches. Again has not tried any medications at home for this. States they normally go away on their own. No sudden, severe headache. No head injury. Again no neurologic deficits. Not on anticoagulation.    ROS: See HPI Constitutional: no fever  Eyes: no drainage  ENT: no runny nose   Cardiovascular:  no chest pain  Resp: no SOB  GI: no vomiting GU: no dysuria Integumentary: no rash  Allergy: no hives  Musculoskeletal: no leg swelling  Neurological: no slurred speech ROS otherwise negative  PAST MEDICAL HISTORY/PAST SURGICAL HISTORY:  Past Medical History  Diagnosis Date  . Depression   . Hypertension   . Degenerative disc disease, cervical   . Sleep apnea   . Migraines     MEDICATIONS:  Prior to Admission  medications   Medication Sig Start Date End Date Taking? Authorizing Provider  Chlorphen-Phenyleph-ASA (ALKA-SELTZER PLUS COLD PO) Take 1 tablet by mouth 2 (two) times daily as needed (for cold symptoms).    Historical Provider, MD  gabapentin (NEURONTIN) 600 MG tablet Take 600 mg by mouth 3 (three) times daily.    Historical Provider, MD  HYDROcodone-acetaminophen (NORCO/VICODIN) 5-325 MG tablet Take 1 tablet by mouth every 4 (four) hours as needed. 12/10/15   Burgess AmorJulie Idol, PA-C  ibuprofen (ADVIL,MOTRIN) 600 MG tablet Take 1 tablet (600 mg total) by mouth every 6 (six) hours as needed. 12/10/15   Burgess AmorJulie Idol, PA-C  metFORMIN (GLUCOPHAGE) 500 MG tablet Take 500 mg by mouth daily.     Historical Provider, MD  omeprazole (PRILOSEC OTC) 20 MG tablet Take 20 mg by mouth daily.    Historical Provider, MD  traZODone (DESYREL) 50 MG tablet Take 50 mg by mouth at bedtime.    Historical Provider, MD  verapamil (VERELAN PM) 240 MG 24 hr capsule Take 240 mg by mouth at bedtime.    Historical Provider, MD    ALLERGIES:  No Known Allergies  SOCIAL HISTORY:  Social History  Substance Use Topics  . Smoking status: Never Smoker   . Smokeless tobacco: Never Used  . Alcohol Use: No    FAMILY HISTORY: No family history on file.  EXAM: BP 145/83 mmHg  Pulse 78  Temp(Src) 98.2 F (36.8 C) (Oral)  Ht 5\' 7"  (1.702 m)  Wt 255 lb (115.667 kg)  BMI 39.93 kg/m2  SpO2 100% CONSTITUTIONAL: Alert and oriented and responds appropriately to questions. Well-appearing; well-nourished obese, afebrile, no distress HEAD: Normocephalic EYES: Conjunctivae clear, PERRL extra ocular movements intact ENT: normal nose; no rhinorrhea; moist mucous membranes NECK: Supple, no meningismus, no LAD  CARD: RRR; S1 and S2 appreciated; no murmurs, no clicks, no rubs, no gallops RESP: Normal chest excursion without splinting or tachypnea; breath sounds clear and equal bilaterally; no wheezes, no rhonchi, no rales, no hypoxia or  respiratory distress, speaking full sentences ABD/GI: Normal bowel sounds; non-distended; soft, non-tender, no rebound, no guarding, no peritoneal signs GU:  Normal external genitalia. No lesions, rashes noted. Patient has no vaginal bleeding on exam. Thin white, yellow vaginal discharge with associated odor.  No adnexal tenderness, mass or fullness, no cervical motion tenderness. Cervix is not appear friable.  Cervix is closed.  Chaperone present for exam. BACK:  The back appears normal and is non-tender to palpation, there is no CVA tenderness, no midline spinal tenderness or step-off or deformity EXT: Normal ROM in all joints; non-tender to palpation; no edema; normal capillary refill; no cyanosis, no calf tenderness or swelling    SKIN: Normal color for age and race; warm; no rash NEURO: Moves all extremities equally, sensation to light touch intact diffusely, cranial nerves II through XII intact, normal gait, no saddle anesthesia PSYCH: The patient's mood and manner are appropriate. Grooming and personal hygiene are appropriate.  MEDICAL DECISION MAKING: Patient here with multiple different complaints. I do not feel that she has any acute emergency present. She has history of chronic headaches reports this feels the same. She has a history of chronic back pain reports this feels the same without neurologic deficit. Also complaining of abdominal pain but has completely benign exam. Complaining of hematuria. Her urinalysis that was initially obtained shows a significant delay dirty catch. This will need to be repeated and will perform catheterization to obtain this urine sample. She is also complaining of vaginal discharge and is sexually active. Will perform pelvic exam with pelvic cultures. I do not feel she needs emergent abdominal imaging, labs at this time.  ED PROGRESS: Patient's pelvic exam reveals thin white yellow appearing discharge with odor coming from the cervical os. No bleeding. No  cervical motion tenderness or adnexal tenderness. Doubt PID, TOA, torsion. Pelvic cultures pending. Will repeat urinalysis.    Patient's wet prep is positive for trichomonas. She is currently married and states she is sexually active with one partner only. She has had Trichomonas from this partner before and was treated and then had a negative test. Have recommended she be treated for Trichomonas today as well as gonorrhea, chlamydia. She has agreed to HIV and syphilis testing as well. Her repeat urine shows small leukocytes and few bacteria with no other sign of infection. Urine culture is pending. I do not feel she is to go home on antibiotics for UTI as I suspect most of her symptoms are from her trichomonas. She does also have clue cells. Given she is symptomatic, will discharge on Flagyl 500 mg twice a day for 1 week.  She reports her back pain, headache, abdominal pain are completely resolved with ibuprofen. Abdominal exam is still benign. Zofran has also relieved her nausea and she is drinking without difficulty. Will discharge with ibuprofen, Zofran, Flagyl. Discussed return precautions. Given outpatient PCP follow-up information.   At this time, I do not feel there is any life-threatening condition present. I have reviewed and discussed all results (EKG, imaging, lab, urine  as appropriate), exam findings with patient. I have reviewed nursing notes and appropriate previous records.  I feel the patient is safe to be discharged home without further emergent workup. Discussed usual and customary return precautions. Patient and family (if present) verbalize understanding and are comfortable with this plan.  Patient will follow-up with their primary care provider. If they do not have a primary care provider, information for follow-up has been provided to them. All questions have been answered.      Sheena Maw Abygail Galeno, DO 03/07/16 [phone]

## 2016-03-07 LAB — WET PREP, GENITAL
SPERM: NONE SEEN
Yeast Wet Prep HPF POC: NONE SEEN

## 2016-03-07 LAB — URINALYSIS, ROUTINE W REFLEX MICROSCOPIC
Bilirubin Urine: NEGATIVE
Glucose, UA: NEGATIVE mg/dL
HGB URINE DIPSTICK: NEGATIVE
KETONES UR: NEGATIVE mg/dL
Nitrite: NEGATIVE
PROTEIN: NEGATIVE mg/dL
Specific Gravity, Urine: 1.02 (ref 1.005–1.030)
pH: 6 (ref 5.0–8.0)

## 2016-03-07 LAB — URINE MICROSCOPIC-ADD ON

## 2016-03-07 MED ORDER — LIDOCAINE HCL (PF) 1 % IJ SOLN
INTRAMUSCULAR | Status: AC
  Administered 2016-03-07: 0.9 mL
  Filled 2016-03-07: qty 5

## 2016-03-07 MED ORDER — METRONIDAZOLE 500 MG PO TABS
2000.0000 mg | ORAL_TABLET | Freq: Once | ORAL | Status: AC
  Administered 2016-03-07: 2000 mg via ORAL
  Filled 2016-03-07: qty 4

## 2016-03-07 MED ORDER — ONDANSETRON 4 MG PO TBDP
4.0000 mg | ORAL_TABLET | Freq: Once | ORAL | Status: AC
  Administered 2016-03-07: 4 mg via ORAL
  Filled 2016-03-07: qty 1

## 2016-03-07 MED ORDER — IBUPROFEN 800 MG PO TABS
800.0000 mg | ORAL_TABLET | Freq: Once | ORAL | Status: AC
  Administered 2016-03-07: 800 mg via ORAL
  Filled 2016-03-07: qty 1

## 2016-03-07 MED ORDER — AZITHROMYCIN 250 MG PO TABS
1000.0000 mg | ORAL_TABLET | Freq: Once | ORAL | Status: AC
  Administered 2016-03-07: 1000 mg via ORAL
  Filled 2016-03-07: qty 4

## 2016-03-07 MED ORDER — IBUPROFEN 800 MG PO TABS: 800.0000 mg | ORAL_TABLET | Freq: Three times a day (TID) | ORAL | Status: DC | PRN

## 2016-03-07 MED ORDER — CEFTRIAXONE SODIUM 250 MG IJ SOLR
250.0000 mg | Freq: Once | INTRAMUSCULAR | Status: AC
  Administered 2016-03-07: 250 mg via INTRAMUSCULAR
  Filled 2016-03-07: qty 250

## 2016-03-07 MED ORDER — ONDANSETRON 4 MG PO TBDP: 4.0000 mg | ORAL_TABLET | Freq: Three times a day (TID) | ORAL | Status: DC | PRN

## 2016-03-07 MED ORDER — METRONIDAZOLE 500 MG PO TABS: 500.0000 mg | ORAL_TABLET | Freq: Two times a day (BID) | ORAL | Status: DC

## 2016-03-07 NOTE — ED Notes (Signed)
Dr Ward at bedside,  

## 2016-03-07 NOTE — Discharge Instructions (Signed)
Back Pain, Adult °Back pain is very common in adults. The cause of back pain is rarely dangerous and the pain often gets better over time. The cause of your back pain may not be known. Some common causes of back pain include: °· Strain of the muscles or ligaments supporting the spine. °· Wear and tear (degeneration) of the spinal disks. °· Arthritis. °· Direct injury to the back. °For many people, back pain may return. Since back pain is rarely dangerous, most people can learn to manage this condition on their own. °HOME CARE INSTRUCTIONS °Watch your back pain for any changes. The following actions may help to lessen any discomfort you are feeling: °· Remain active. It is stressful on your back to sit or stand in one place for long periods of time. Do not sit, drive, or stand in one place for more than 30 minutes at a time. Take short walks on even surfaces as soon as you are able. Try to increase the length of time you walk each day. °· Exercise regularly as directed by your health care provider. Exercise helps your back heal faster. It also helps avoid future injury by keeping your muscles strong and flexible. °· Do not stay in bed. Resting more than 1-2 days can delay your recovery. °· Pay attention to your body when you bend and lift. The most comfortable positions are those that put less stress on your recovering back. Always use proper lifting techniques, including: °· Bending your knees. °· Keeping the load close to your body. °· Avoiding twisting. °· Find a comfortable position to sleep. Use a firm mattress and lie on your side with your knees slightly bent. If you lie on your back, put a pillow under your knees. °· Avoid feeling anxious or stressed. Stress increases muscle tension and can worsen back pain. It is important to recognize when you are anxious or stressed and learn ways to manage it, such as with exercise. °· Take medicines only as directed by your health care provider. Over-the-counter  medicines to reduce pain and inflammation are often the most helpful. Your health care provider may prescribe muscle relaxant drugs. These medicines help dull your pain so you can more quickly return to your normal activities and healthy exercise. °· Apply ice to the injured area: °· Put ice in a plastic bag. °· Place a towel between your skin and the bag. °· Leave the ice on for 20 minutes, 2-3 times a day for the first 2-3 days. After that, ice and heat may be alternated to reduce pain and spasms. °· Maintain a healthy weight. Excess weight puts extra stress on your back and makes it difficult to maintain good posture. °SEEK MEDICAL CARE IF: °· You have pain that is not relieved with rest or medicine. °· You have increasing pain going down into the legs or buttocks. °· You have pain that does not improve in one week. °· You have night pain. °· You lose weight. °· You have a fever or chills. °SEEK IMMEDIATE MEDICAL CARE IF:  °· You develop new bowel or bladder control problems. °· You have unusual weakness or numbness in your arms or legs. °· You develop nausea or vomiting. °· You develop abdominal pain. °· You feel faint. °  °This information is not intended to replace advice given to you by your health care provider. Make sure you discuss any questions you have with your health care provider. °  °Document Released: 10/06/2005 Document Revised: 10/27/2014 Document Reviewed: 02/07/2014 °Elsevier Interactive Patient Education ©2016 Elsevier   Inc[employer]  Migraine Headache A migraine headache is an intense, throbbing pain on one or both sides of your head. A migraine can last for 30 minutes to several hours. CAUSES  The exact cause of a migraine headache is not always known. However, a migraine may be caused when nerves in the brain become irritated and release chemicals that cause inflammation. This causes pain. Certain things may also trigger migraines, such  as:  Alcohol.  Smoking.  Stress.  Menstruation.  Aged cheeses.  Foods or drinks that contain nitrates, glutamate, aspartame, or tyramine.  Lack of sleep.  Chocolate.  Caffeine.  Hunger.  Physical exertion.  Fatigue.  Medicines used to treat chest pain (nitroglycerine), birth control pills, estrogen, and some blood pressure medicines. SIGNS AND SYMPTOMS  Pain on one or both sides of your head.  Pulsating or throbbing pain.  Severe pain that prevents daily activities.  Pain that is aggravated by any physical activity.  Nausea, vomiting, or both.  Dizziness.  Pain with exposure to bright lights, loud noises, or activity.  General sensitivity to bright lights, loud noises, or smells. Before you get a migraine, you may get warning signs that a migraine is coming (aura). An aura may include:  Seeing flashing lights.  Seeing bright spots, halos, or zigzag lines.  Having tunnel vision or blurred vision.  Having feelings of numbness or tingling.  Having trouble talking.  Having muscle weakness. DIAGNOSIS  A migraine headache is often diagnosed based on:  Symptoms.  Physical exam.  A CT scan or MRI of your head. These imaging tests cannot diagnose migraines, but they can help rule out other causes of headaches. TREATMENT Medicines may be given for pain and nausea. Medicines can also be given to help prevent recurrent migraines.  HOME CARE INSTRUCTIONS  Only take over-the-counter or prescription medicines for pain or discomfort as directed by your health care provider. The use of long-term narcotics is not recommended.  Lie down in a dark, quiet room when you have a migraine.  Keep a journal to find out what may trigger your migraine headaches. For example, write down:  What you eat and drink.  How much sleep you get.  Any change to your diet or medicines.  Limit alcohol consumption.  Quit smoking if you smoke.  Get 7-9 hours of sleep, or as  recommended by your health care provider.  Limit stress.  Keep lights dim if bright lights bother you and make your migraines worse. SEEK IMMEDIATE MEDICAL CARE IF:   Your migraine becomes severe.  You have a fever.  You have a stiff neck.  You have vision loss.  You have muscular weakness or loss of muscle control.  You start losing your balance or have trouble walking.  You feel faint or pass out.  You have severe symptoms that are different from your first symptoms. MAKE SURE YOU:   Understand these instructions.  Will watch your condition.  Will get help right away if you are not doing well or get worse.   This information is not intended to replace advice given to you by your health care provider. Make sure you discuss any questions you have with your health care provider.   Document Released: 10/06/2005 Document Revised: 10/27/2014 Document Reviewed: 06/13/2013 Elsevier Interactive Patient Education 2016 [employer].  Sexually Transmitted Disease A sexually transmitted disease (STD) is a disease or infection that may be passed (transmitted) from person to person, usually during sexual activity. This may happen by way  of saliva, semen, blood, vaginal mucus, or urine. Common STDs include:  Gonorrhea.  Chlamydia.  Syphilis.  HIV and AIDS.  Genital herpes.  Hepatitis B and C.  Trichomonas.  Human papillomavirus (HPV).  Pubic lice.  Scabies.  Mites.  Bacterial vaginosis. WHAT ARE CAUSES OF STDs? An STD may be caused by bacteria, a virus, or parasites. STDs are often transmitted during sexual activity if one person is infected. However, they may also be transmitted through nonsexual means. STDs may be transmitted after:   Sexual intercourse with an infected person.  Sharing sex toys with an infected person.  Sharing needles with an infected person or using unclean piercing or tattoo needles.  Having intimate contact with the genitals, mouth, or  rectal areas of an infected person.  Exposure to infected fluids during birth. WHAT ARE THE SIGNS AND SYMPTOMS OF STDs? Different STDs have different symptoms. Some people may not have any symptoms. If symptoms are present, they may include:  Painful or bloody urination.  Pain in the pelvis, abdomen, vagina, anus, throat, or eyes.  A skin rash, itching, or irritation.  Growths, ulcerations, blisters, or sores in the genital and anal areas.  Abnormal vaginal discharge with or without bad odor.  Penile discharge in men.  Fever.  Pain or bleeding during sexual intercourse.  Swollen glands in the groin area.  Yellow skin and eyes (jaundice). This is seen with hepatitis.  Swollen testicles.  Infertility.  Sores and blisters in the mouth. HOW ARE STDs DIAGNOSED? To make a diagnosis, your health care provider may:  Take a medical history.  Perform a physical exam.  Take a sample of any discharge to examine.  Swab the throat, cervix, opening to the penis, rectum, or vagina for testing.  Test a sample of your first morning urine.  Perform blood tests.  Perform a Pap test, if this applies.  Perform a colposcopy.  Perform a laparoscopy. HOW ARE STDs TREATED? Treatment depends on the STD. Some STDs may be treated but not cured.  Chlamydia, gonorrhea, trichomonas, and syphilis can be cured with antibiotic medicine.  Genital herpes, hepatitis, and HIV can be treated, but not cured, with prescribed medicines. The medicines lessen symptoms.  Genital warts from HPV can be treated with medicine or by freezing, burning (electrocautery), or surgery. Warts may come back.  HPV cannot be cured with medicine or surgery. However, abnormal areas may be removed from the cervix, vagina, or vulva.  If your diagnosis is confirmed, your recent sexual partners need treatment. This is true even if they are symptom-free or have a negative culture or evaluation. They should not have sex  until their health care providers say it is okay.  Your health care provider may test you for infection again 3 months after treatment. HOW CAN I REDUCE MY RISK OF GETTING AN STD? Take these steps to reduce your risk of getting an STD:  Use latex condoms, dental dams, and water-soluble lubricants during sexual activity. Do not use petroleum jelly or oils.  Avoid having multiple sex partners.  Do not have sex with someone who has other sex partners  Do not have sex with anyone you do not know or who is at high risk for an STD.  Avoid risky sex practices that can break your skin.  Do not have sex if you have open sores on your mouth or skin.  Avoid drinking too much alcohol or taking illegal drugs. Alcohol and drugs can affect your judgment and put you  in a vulnerable position.  Avoid engaging in oral and anal sex acts.  Get vaccinated for HPV and hepatitis. If you have not received these vaccines in the past, talk to your health care provider about whether one or both might be right for you.  If you are at risk of being infected with HIV, it is recommended that you take a prescription medicine daily to prevent HIV infection. This is called pre-exposure prophylaxis (PrEP). You are considered at risk if:  You are a man who has sex with other men (MSM).  You are a heterosexual man or woman and are sexually active with more than one partner.  You take drugs by injection.  You are sexually active with a partner who has HIV.  Talk with your health care provider about whether you are at high risk of being infected with HIV. If you choose to begin PrEP, you should first be tested for HIV. You should then be tested every 3 months for as long as you are taking PrEP. WHAT SHOULD I DO IF I THINK I HAVE AN STD?  See your health care provider.  Tell your sexual partner(s). They should be tested and treated for any STDs.  Do not have sex until your health care provider says it is okay. WHEN  SHOULD I GET IMMEDIATE MEDICAL CARE? Contact your health care provider right away if:   You have severe abdominal pain.  You are a man and notice swelling or pain in your testicles.  You are a woman and notice swelling or pain in your vagina.   This information is not intended to replace advice given to you by your health care provider. Make sure you discuss any questions you have with your health care provider.   Document Released: 12/27/2002 Document Revised: 10/27/2014 Document Reviewed: 04/26/2013 Elsevier Interactive Patient Education 2016 [employer].  Trichomoniasis Trichomoniasis is an infection caused by an organism called Trichomonas. The infection can affect both women and men. In women, the outer female genitalia and the vagina are affected. In men, the penis is mainly affected, but the prostate and other reproductive organs can also be involved. Trichomoniasis is a sexually transmitted infection (STI) and is most often passed to another person through sexual contact.  RISK FACTORS  Having unprotected sexual intercourse.  Having sexual intercourse with an infected partner. SIGNS AND SYMPTOMS  Symptoms of trichomoniasis in women include:  Abnormal gray-green frothy vaginal discharge.  Itching and irritation of the vagina.  Itching and irritation of the area outside the vagina. Symptoms of trichomoniasis in men include:   Penile discharge with or without pain.  Pain during urination. This results from inflammation of the urethra. DIAGNOSIS  Trichomoniasis may be found during a Pap test or physical exam. Your health care provider may use one of the following methods to help diagnose this infection:  Testing the pH of the vagina with a test tape.  Using a vaginal swab test that checks for the Trichomonas organism. A test is available that provides results within a few minutes.  Examining a urine sample.  Testing vaginal secretions. Your health care provider may  test you for other STIs, including HIV. TREATMENT   You may be given medicine to fight the infection. Women should inform their health care provider if they could be or are pregnant. Some medicines used to treat the infection should not be taken during pregnancy.  Your health care provider may recommend over-the-counter medicines or creams to decrease itching or  irritation.  Your sexual partner will need to be treated if infected.  Your health care provider may test you for infection again 3 months after treatment. HOME CARE INSTRUCTIONS   Take medicines only as directed by your health care provider.  Take over-the-counter medicine for itching or irritation as directed by your health care provider.  Do not have sexual intercourse while you have the infection.  Women should not douche or wear tampons while they have the infection.  Discuss your infection with your partner. Your partner may have gotten the infection from you, or you may have gotten it from your partner.  Have your sex partner get examined and treated if necessary.  Practice safe, informed, and protected sex.  See your health care provider for other STI testing. SEEK MEDICAL CARE IF:   You still have symptoms after you finish your medicine.  You develop abdominal pain.  You have pain when you urinate.  You have bleeding after sexual intercourse.  You develop a rash.  Your medicine makes you sick or makes you throw up (vomit). MAKE SURE YOU:  Understand these instructions.  Will watch your condition.  Will get help right away if you are not doing well or get worse.   This information is not intended to replace advice given to you by your health care provider. Make sure you discuss any questions you have with your health care provider.   Document Released: 04/01/2001 Document Revised: 10/27/2014 Document Reviewed: 07/18/2013 Elsevier Interactive Patient Education [employer]

## 2016-03-07 NOTE — ED Notes (Signed)
Pt states that she is feeling better, requesting crackers and water,

## 2016-03-08 LAB — URINE CULTURE: Culture: NO GROWTH

## 2016-03-08 LAB — RPR: RPR: NONREACTIVE

## 2016-03-08 LAB — HIV ANTIBODY (ROUTINE TESTING W REFLEX): HIV SCREEN 4TH GENERATION: NONREACTIVE

## 2016-03-10 LAB — GC/CHLAMYDIA PROBE AMP (~~LOC~~) NOT AT ARMC
Chlamydia: NEGATIVE
NEISSERIA GONORRHEA: NEGATIVE

## 2016-09-29 ENCOUNTER — Emergency Department (HOSPITAL_COMMUNITY)
Admission: EM | Admit: 2016-09-29 | Discharge: 2016-09-29 | Disposition: A | Discharge status: Home / Self Care | Payer: Managed Care, Other (non HMO) | Attending: Emergency Medicine | Admitting: Emergency Medicine

## 2016-09-29 ENCOUNTER — Emergency Department (HOSPITAL_COMMUNITY): Payer: Managed Care, Other (non HMO)

## 2016-09-29 ENCOUNTER — Encounter (HOSPITAL_COMMUNITY): Payer: Self-pay | Admitting: Emergency Medicine

## 2016-09-29 DIAGNOSIS — M79642 Pain in left hand: Secondary | ICD-10-CM | POA: Diagnosis present

## 2016-09-29 DIAGNOSIS — M779 Enthesopathy, unspecified: Secondary | ICD-10-CM | POA: Diagnosis not present

## 2016-09-29 DIAGNOSIS — I10 Essential (primary) hypertension: Secondary | ICD-10-CM | POA: Diagnosis not present

## 2016-09-29 DIAGNOSIS — M778 Other enthesopathies, not elsewhere classified: Secondary | ICD-10-CM

## 2016-09-29 DIAGNOSIS — Z79899 Other long term (current) drug therapy: Secondary | ICD-10-CM | POA: Diagnosis not present

## 2016-09-29 MED ORDER — IBUPROFEN 800 MG PO TABS: 800.0000 mg | ORAL_TABLET | Freq: Three times a day (TID) | ORAL | 0 refills | Status: DC | PRN

## 2016-09-29 NOTE — ED Provider Notes (Signed)
[facility]-EMERGENCY DEPT Provider Note   CSN: [id number] Arrival date & time: 09/29/16  1712  By signing my name below, I, Rosario AdieWilliam Andrew Hiatt, attest that this documentation has been prepared under the direction and in the presence of Langston MaskerKaren Jozalynn Noyce, [place]PA-C.  Electronically Signed: Rosario AdieWilliam Andrew Hiatt, ED Scribe. 09/29/16. 7:39 PM.  History   Chief Complaint Chief Complaint  Patient presents with  . Hand Pain   The history is provided by the patient. No language interpreter was used.    HPI Comments: Sheena Cain is a right-hand dominant 50 y.o. female with a  PMHx of HTN, who presents to the Emergency Department complaining of gradually worsening, moderate pain to the left first digit w/ associated swelling onset approximately 2 weeks ago. She notes associated intermittent paraesthesias and subjective numbness to the digit since the onset of her pain. No recent trauma or injury to the digit. 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 No h/o similar pain. No noted treatments were tried prior to coming into the ED. She denies weakness, or any other associated symptoms.   Past Medical History:  Diagnosis Date  . Degenerative disc disease, cervical   . Depression   . Hypertension   . Migraines   . Sleep apnea    Patient Active Problem List   Diagnosis Date Noted  . Adjustment disorder with mixed anxiety and depressed mood 03/28/2015  . Depression, major, recurrent, mild (HCC) 03/28/2015  . High blood pressure 03/28/2015  . Chronic arthritis 03/28/2015   Past Surgical History:  Procedure Laterality Date  . ABDOMINAL HYSTERECTOMY    . VAGINAL HYSTERECTOMY     OB History    Gravida Para Term Preterm AB Living   1         1   SAB TAB Ectopic Multiple Live Births                 Home Medications    Prior to Admission medications   Medication Sig Start Date End Date Taking? Authorizing Provider    Chlorphen-Phenyleph-ASA (ALKA-SELTZER PLUS COLD PO) Take 1 tablet by mouth 2 (two) times daily as needed (for cold symptoms).    Historical Provider, MD  gabapentin (NEURONTIN) 600 MG tablet Take 600 mg by mouth 3 (three) times daily.    Historical Provider, MD  HYDROcodone-acetaminophen (NORCO/VICODIN) 5-325 MG tablet Take 1 tablet by mouth every 4 (four) hours as needed. 12/10/15   Burgess AmorJulie Idol, PA-C  ibuprofen (ADVIL,MOTRIN) 800 MG tablet Take 1 tablet (800 mg total) by mouth every 8 (eight) hours as needed for mild pain. 03/07/16   Kristen N Ward, DO  metFORMIN (GLUCOPHAGE) 500 MG tablet Take 500 mg by mouth daily.     Historical Provider, MD  metroNIDAZOLE (FLAGYL) 500 MG tablet Take 1 tablet (500 mg total) by mouth 2 (two) times daily. 03/07/16   Kristen N Ward, DO  omeprazole (PRILOSEC OTC) 20 MG tablet Take 20 mg by mouth daily.    Historical Provider, MD  ondansetron (ZOFRAN ODT) 4 MG disintegrating tablet Take 1 tablet (4 mg total) by mouth every 8 (eight) hours as needed for nausea or vomiting. 03/07/16   Kristen N Ward, DO  traZODone (DESYREL) 50 MG tablet Take 50 mg by mouth at bedtime.    Historical Provider, MD  verapamil (VERELAN PM) 240 MG 24 hr capsule Take 240 mg by mouth at bedtime.    Historical Provider,  MD   Family History Family History  Problem Relation Age of Onset  . Hypertension Mother   . Hypertension Father    Social History Social History  Substance Use Topics  . Smoking status: Never Smoker  . Smokeless tobacco: Never Used  . Alcohol use No   Allergies   Patient has no known allergies.  Review of Systems Review of Systems  Musculoskeletal: Positive for arthralgias (left first digit), joint swelling (left first digit ) and myalgias.  Neurological: Positive for numbness. Negative for weakness.  All other systems reviewed and are negative.  Physical Exam Updated Vital Signs BP 150/87 (BP Location: Right Arm)   Pulse 71   Temp 98.7 F (37.1 C) (Oral)    Resp 16   Ht 5\' 7"  (1.702 m)   Wt 250 lb (113.4 kg)   SpO2 100%   BMI 39.16 kg/m   Physical Exam  Constitutional: She appears well-developed and well-nourished. No distress.  HENT:  Head: Normocephalic and atraumatic.  Eyes: Conjunctivae are normal.  Neck: Normal range of motion.  Cardiovascular: Normal rate.   Pulmonary/Chest: Effort normal.  Abdominal: She exhibits no distension.  Musculoskeletal: She exhibits tenderness.  Tender over the thenar prominence. Pain with ROM. Pain with thumb loading.   Neurological: She is alert.  Skin: No pallor.  Psychiatric: She has a normal mood and affect. Her behavior is normal.  Nursing note and vitals reviewed.  ED Treatments / Results  DIAGNOSTIC STUDIES: Oxygen Saturation is 100% on RA, normal by my interpretation.   COORDINATION OF CARE: 7:39 PM-Discussed next steps with pt. Pt verbalized understanding and is agreeable with the plan.   Labs (all labs ordered are listed, but only abnormal results are displayed) Labs Reviewed - No data to display  EKG  EKG Interpretation None      Radiology No results found.  Procedures Procedures   Medications Ordered in ED Medications - No data to display  Initial Impression / Assessment and Plan / ED Course  I have reviewed the triage vital signs and the nursing notes.  Pertinent labs & imaging results that were available during my care of the patient were reviewed by me and considered in my medical decision making (see chart for details).  Clinical Course      Final Clinical Impressions(s) / ED Diagnoses   Final diagnoses:  Thumb tendonitis   New Prescriptions New Prescriptions   No medications on file   An After Visit Summary was printed and given to the patient.  I personally performed the services in this documentation, which was scribed in my presence.  The recorded information has been reviewed and considered.   Barnet PallKaren SofiaPAC.   Lonia SkinnerLeslie K Palo VerdeSofia, PA-C 09/29/16  2225    Jacalyn LefevreJulie Haviland, MD 09/29/16 [phone]

## 2016-09-29 NOTE — Discharge Instructions (Signed)
Return if any problems.  Schedule to see Dr. Harrison for evaluation  °

## 2016-09-29 NOTE — ED Triage Notes (Signed)
Pt reports pain from her elbow to her index finger on her R hand that has been going on for about 2 months, numbness and pain that became worse over the past 2 days. Pt also c/o pain in her L thumb that started 2 weeks ago. No known injury.

## 2016-12-11 ENCOUNTER — Encounter (HOSPITAL_COMMUNITY): Payer: Self-pay | Admitting: *Deleted

## 2016-12-11 ENCOUNTER — Emergency Department (HOSPITAL_COMMUNITY)
Admission: EM | Admit: 2016-12-11 | Discharge: 2016-12-11 | Disposition: A | Discharge status: Home / Self Care | Payer: BLUE CROSS/BLUE SHIELD | Attending: Emergency Medicine | Admitting: Emergency Medicine

## 2016-12-11 DIAGNOSIS — G8929 Other chronic pain: Secondary | ICD-10-CM | POA: Insufficient documentation

## 2016-12-11 DIAGNOSIS — M545 Low back pain: Secondary | ICD-10-CM | POA: Diagnosis present

## 2016-12-11 DIAGNOSIS — I10 Essential (primary) hypertension: Secondary | ICD-10-CM | POA: Insufficient documentation

## 2016-12-11 DIAGNOSIS — M5440 Lumbago with sciatica, unspecified side: Secondary | ICD-10-CM | POA: Insufficient documentation

## 2016-12-11 DIAGNOSIS — Z79899 Other long term (current) drug therapy: Secondary | ICD-10-CM | POA: Diagnosis not present

## 2016-12-11 HISTORY — DX: Cardiac arrhythmia, unspecified: I49.9

## 2016-12-11 MED ORDER — METHOCARBAMOL 500 MG PO TABS: 500.0000 mg | ORAL_TABLET | Freq: Two times a day (BID) | ORAL | 0 refills | Status: DC | PRN

## 2016-12-11 MED ORDER — DEXAMETHASONE SODIUM PHOSPHATE 10 MG/ML IJ SOLN
6.0000 mg | Freq: Once | INTRAMUSCULAR | Status: AC
  Administered 2016-12-11: 6 mg via INTRAMUSCULAR
  Filled 2016-12-11: qty 1

## 2016-12-11 MED ORDER — IBUPROFEN 800 MG PO TABS: 800.0000 mg | ORAL_TABLET | Freq: Three times a day (TID) | ORAL | 0 refills | Status: DC

## 2016-12-11 NOTE — Discharge Instructions (Signed)

## 2016-12-11 NOTE — ED Triage Notes (Addendum)
Pt c/o lower back pain that radiates down bilateral legs due to bulging discs x 3-4 days. Pt reports she does not see a specialist or have a PCP at this time to manage this illness. Pt denies urinary and bowel difficulties, numbness and tingling. Pt reports she has been using Ibuprofen 800mg  OTC at home with no relief.

## 2016-12-11 NOTE — ED Provider Notes (Signed)
[facility]-EMERGENCY DEPT Provider Note   CSN: [id number] Arrival date & time: 12/11/16  0703     History   Chief Complaint Chief Complaint  Patient presents with  . Back Pain    HPI Sheena Cain is a 51 y.o. female.  HPI  The patient is a 51 year old female, she reports a history of back pain diagnosed several years ago with herniated disks on MRI that was done at [facility] area and she reports that she was treated conservatively with ibuprofen, there was no surgery planned, she has had intermittent pains over time but states that she has had increased pains over the last 3 days which seems to be all that time despite position. It is located in the bilateral lower back as well as the midline radiating into the bilateral lower extremities. She reports that it is a shooting pain that radiates down her legs, sometimes as low as her feet. She has had no numbness or weakness of the legs, no urinary retention or incontinence, no fevers, no history of IV drug use or cancer. She has been using ibuprofen the last few days but has not had much relief  Past Medical History:  Diagnosis Date  . Degenerative disc disease, cervical   . Depression   . Hypertension   . Irregular heart beat   . Migraines   . Sleep apnea     Patient Active Problem List   Diagnosis Date Noted  . Adjustment disorder with mixed anxiety and depressed mood 03/28/2015  . Depression, major, recurrent, mild (HCC) 03/28/2015  . High blood pressure 03/28/2015  . Chronic arthritis 03/28/2015    Past Surgical History:  Procedure Laterality Date  . ABDOMINAL HYSTERECTOMY    . VAGINAL HYSTERECTOMY      OB History    Gravida Para Term Preterm AB Living   1         1   SAB TAB Ectopic Multiple Live Births                   Home Medications    Prior to Admission medications   Medication Sig Start Date End Date Taking? Authorizing Provider  Chlorphen-Phenyleph-ASA (ALKA-SELTZER PLUS COLD PO) Take 1 tablet by mouth 2  (two) times daily as needed (for cold symptoms).    Historical Provider, MD  gabapentin (NEURONTIN) 600 MG tablet Take 600 mg by mouth 3 (three) times daily.    Historical Provider, MD  HYDROcodone-acetaminophen (NORCO/VICODIN) 5-325 MG tablet Take 1 tablet by mouth every 4 (four) hours as needed. 12/10/15   Burgess Amor, PA-C  ibuprofen (ADVIL,MOTRIN) 800 MG tablet Take 1 tablet (800 mg total) by mouth 3 (three) times daily. 12/11/16   Eber Hong, MD  metFORMIN (GLUCOPHAGE) 500 MG tablet Take 500 mg by mouth daily.     Historical Provider, MD  methocarbamol (ROBAXIN) 500 MG tablet Take 1 tablet (500 mg total) by mouth 2 (two) times daily as needed for muscle spasms. 12/11/16   Eber Hong, MD  metroNIDAZOLE (FLAGYL) 500 MG tablet Take 1 tablet (500 mg total) by mouth 2 (two) times daily. 03/07/16   Kristen N Ward, DO  omeprazole (PRILOSEC OTC) 20 MG tablet Take 20 mg by mouth daily.    Historical Provider, MD  ondansetron (ZOFRAN ODT) 4 MG disintegrating tablet Take 1 tablet (4 mg total) by mouth every 8 (eight) hours as needed for nausea or vomiting. 03/07/16   Kristen N Ward, DO  traZODone (DESYREL) 50 MG tablet Take 50  mg by mouth at bedtime.    Historical Provider, MD  verapamil (VERELAN PM) 240 MG 24 hr capsule Take 240 mg by mouth at bedtime.    Historical Provider, MD    Family History Family History  Problem Relation Age of Onset  . Hypertension Mother   . Hypertension Father     Social History Social History  Substance Use Topics  . Smoking status: Never Smoker  . Smokeless tobacco: Never Used  . Alcohol use No     Allergies   Patient has no known allergies.   Review of Systems Review of Systems  Constitutional: Negative for fever.  Musculoskeletal: Positive for back pain.  Neurological: Negative for weakness and numbness.     Physical Exam Updated Vital Signs BP 133/82 (BP Location: Left Arm)   Pulse 68   Temp 97.9 F (36.6 C) (Oral)   Resp 16   Ht 5\' 7"  (1.702  m)   Wt 253 lb (114.8 kg)   SpO2 98%   BMI 39.63 kg/m   Physical Exam  Constitutional: She appears well-developed and well-nourished.  HENT:  Head: Normocephalic and atraumatic.  Eyes: Conjunctivae are normal. Right eye exhibits no discharge. Left eye exhibits no discharge.  Pulmonary/Chest: Effort normal. No respiratory distress.  Musculoskeletal:  Tender to palpation over the lumbar spine as well as the paraspinal muscles on the right and the left, tenderness to palpation over the proximal buttock  Neurological: She is alert. Coordination normal.  Normal strength and sensation in the bilateral lower extremities, gait is normal as well  Skin: Skin is warm and dry. No rash noted. She is not diaphoretic. No erythema.  Psychiatric: She has a normal mood and affect.  Nursing note and vitals reviewed.    ED Treatments / Results  Labs (all labs ordered are listed, but only abnormal results are displayed) Labs Reviewed - No data to display   Radiology No results found.  Procedures Procedures (including critical care time)  Medications Ordered in ED Medications  dexamethasone (DECADRON) injection 6 mg (not administered)     Initial Impression / Assessment and Plan / ED Course  I have reviewed the triage vital signs and the nursing notes.  Pertinent labs & imaging results that were available during my care of the patient were reviewed by me and considered in my medical decision making (see chart for details).   This time the patient appears to have consistent symptoms with sciatica, she will be given a shot of Decadron and started on higher dose anti-inflammatories with a muscle relaxant and encouraged to follow-up with spinal surgery. She will be given a referral as she does not want to go back to the place where she was seen in the past.  Final Clinical Impressions(s) / ED Diagnoses   Final diagnoses:  Chronic bilateral low back pain with sciatica, sciatica laterality  unspecified    New Prescriptions New Prescriptions   IBUPROFEN (ADVIL,MOTRIN) 800 MG TABLET    Take 1 tablet (800 mg total) by mouth 3 (three) times daily.   METHOCARBAMOL (ROBAXIN) 500 MG TABLET    Take 1 tablet (500 mg total) by mouth 2 (two) times daily as needed for muscle spasms.     Eber HongBrian Oleta Gunnoe, MD 12/11/16 [phone]

## 2017-01-21 ENCOUNTER — Encounter (HOSPITAL_COMMUNITY): Payer: Self-pay | Admitting: *Deleted

## 2017-01-21 ENCOUNTER — Emergency Department (HOSPITAL_COMMUNITY)
Admission: EM | Admit: 2017-01-21 | Discharge: 2017-01-21 | Disposition: A | Discharge status: Home / Self Care | Payer: BLUE CROSS/BLUE SHIELD | Attending: Dermatology | Admitting: Dermatology

## 2017-01-21 DIAGNOSIS — I10 Essential (primary) hypertension: Secondary | ICD-10-CM | POA: Insufficient documentation

## 2017-01-21 DIAGNOSIS — Z7984 Long term (current) use of oral hypoglycemic drugs: Secondary | ICD-10-CM | POA: Diagnosis not present

## 2017-01-21 DIAGNOSIS — R51 Headache: Secondary | ICD-10-CM | POA: Diagnosis present

## 2017-01-21 DIAGNOSIS — Z5321 Procedure and treatment not carried out due to patient leaving prior to being seen by health care provider: Secondary | ICD-10-CM | POA: Diagnosis not present

## 2017-01-21 DIAGNOSIS — Z79899 Other long term (current) drug therapy: Secondary | ICD-10-CM | POA: Insufficient documentation

## 2017-01-21 LAB — COMPREHENSIVE METABOLIC PANEL
ALBUMIN: 3.9 g/dL (ref 3.5–5.0)
ALT: 20 U/L (ref 14–54)
AST: 19 U/L (ref 15–41)
Alkaline Phosphatase: 46 U/L (ref 38–126)
Anion gap: 10 (ref 5–15)
BILIRUBIN TOTAL: 0.3 mg/dL (ref 0.3–1.2)
BUN: 13 mg/dL (ref 6–20)
CHLORIDE: 103 mmol/L (ref 101–111)
CO2: 27 mmol/L (ref 22–32)
Calcium: 9.2 mg/dL (ref 8.9–10.3)
Creatinine, Ser: 0.78 mg/dL (ref 0.44–1.00)
GFR calc Af Amer: >60 mL/min (ref >60)
GFR calc non Af Amer: >60 mL/min (ref >60)
GLUCOSE: 95 mg/dL (ref 65–99)
POTASSIUM: 3.9 mmol/L (ref 3.5–5.1)
Sodium: 140 mmol/L (ref 135–145)
Total Protein: 7.4 g/dL (ref 6.5–8.1)

## 2017-01-21 LAB — CBC
HEMATOCRIT: 39.5 % (ref 36.0–46.0)
Hemoglobin: 12.9 g/dL (ref 12.0–15.0)
MCH: 26.8 pg (ref 26.0–34.0)
MCHC: 32.7 g/dL (ref 30.0–36.0)
MCV: 82 fL (ref 78.0–100.0)
Platelets: 199 10*3/uL (ref 150–400)
RBC: 4.82 MIL/uL (ref 3.87–5.11)
RDW: 14.1 % (ref 11.5–15.5)
WBC: 6.8 10*3/uL (ref 4.0–10.5)

## 2017-01-21 LAB — LIPASE, BLOOD: LIPASE: 18 U/L (ref 11–51)

## 2017-01-21 NOTE — ED Triage Notes (Signed)
Pt comes in for headache and nausea starting last Thursday. Diarrhea present as well, this started 3 days ago. NAD noted.

## 2017-01-21 NOTE — ED Notes (Signed)
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

## 2017-05-27 ENCOUNTER — Emergency Department (HOSPITAL_COMMUNITY)
Admission: EM | Admit: 2017-05-27 | Discharge: 2017-05-27 | Disposition: A | Discharge status: Home / Self Care | Payer: BLUE CROSS/BLUE SHIELD | Attending: Emergency Medicine | Admitting: Emergency Medicine

## 2017-05-27 ENCOUNTER — Emergency Department (HOSPITAL_COMMUNITY): Payer: BLUE CROSS/BLUE SHIELD

## 2017-05-27 DIAGNOSIS — Z79899 Other long term (current) drug therapy: Secondary | ICD-10-CM | POA: Insufficient documentation

## 2017-05-27 DIAGNOSIS — R51 Headache: Secondary | ICD-10-CM | POA: Insufficient documentation

## 2017-05-27 DIAGNOSIS — R42 Dizziness and giddiness: Secondary | ICD-10-CM | POA: Diagnosis not present

## 2017-05-27 DIAGNOSIS — R200 Anesthesia of skin: Secondary | ICD-10-CM | POA: Insufficient documentation

## 2017-05-27 DIAGNOSIS — I10 Essential (primary) hypertension: Secondary | ICD-10-CM | POA: Insufficient documentation

## 2017-05-27 LAB — DIFFERENTIAL
BASOS ABS: 0 10*3/uL (ref 0.0–0.1)
Basophils Relative: 0 %
EOS ABS: 0.1 10*3/uL (ref 0.0–0.7)
Eosinophils Relative: 2 %
LYMPHS ABS: 3.5 10*3/uL (ref 0.7–4.0)
LYMPHS PCT: 44 %
MONOS PCT: 9 %
Monocytes Absolute: 0.7 10*3/uL (ref 0.1–1.0)
NEUTROS ABS: 3.7 10*3/uL (ref 1.7–7.7)
NEUTROS PCT: 45 %

## 2017-05-27 LAB — TROPONIN I: Troponin I: <0.03 ng/mL (ref <0.03)

## 2017-05-27 LAB — PROTIME-INR
INR: 0.97
Prothrombin Time: 12.9 seconds (ref 11.4–15.2)

## 2017-05-27 LAB — COMPREHENSIVE METABOLIC PANEL
ALBUMIN: 3.8 g/dL (ref 3.5–5.0)
ALK PHOS: 47 U/L (ref 38–126)
ALT: 21 U/L (ref 14–54)
AST: 20 U/L (ref 15–41)
Anion gap: 8 (ref 5–15)
BILIRUBIN TOTAL: 0.3 mg/dL (ref 0.3–1.2)
BUN: 23 mg/dL — AB (ref 6–20)
CO2: 23 mmol/L (ref 22–32)
CREATININE: 0.74 mg/dL (ref 0.44–1.00)
Calcium: 9 mg/dL (ref 8.9–10.3)
Chloride: 104 mmol/L (ref 101–111)
GFR calc Af Amer: >60 mL/min (ref >60)
GFR calc non Af Amer: >60 mL/min (ref >60)
GLUCOSE: 83 mg/dL (ref 65–99)
POTASSIUM: 4.1 mmol/L (ref 3.5–5.1)
Sodium: 135 mmol/L (ref 135–145)
TOTAL PROTEIN: 7.2 g/dL (ref 6.5–8.1)

## 2017-05-27 LAB — CBC
HEMATOCRIT: 35.9 % — AB (ref 36.0–46.0)
HEMOGLOBIN: 11.9 g/dL — AB (ref 12.0–15.0)
MCH: 27.2 pg (ref 26.0–34.0)
MCHC: 33.1 g/dL (ref 30.0–36.0)
MCV: 82.2 fL (ref 78.0–100.0)
Platelets: 280 10*3/uL (ref 150–400)
RBC: 4.37 MIL/uL (ref 3.87–5.11)
RDW: 14.6 % (ref 11.5–15.5)
WBC: 8 10*3/uL (ref 4.0–10.5)

## 2017-05-27 LAB — URINALYSIS, ROUTINE W REFLEX MICROSCOPIC
Bilirubin Urine: NEGATIVE
GLUCOSE, UA: NEGATIVE mg/dL
HGB URINE DIPSTICK: NEGATIVE
KETONES UR: NEGATIVE mg/dL
LEUKOCYTES UA: NEGATIVE
Nitrite: NEGATIVE
PH: 6 (ref 5.0–8.0)
Protein, ur: NEGATIVE mg/dL
Specific Gravity, Urine: 1.009 (ref 1.005–1.030)

## 2017-05-27 LAB — CBG MONITORING, ED: Glucose-Capillary: 69 mg/dL (ref 65–99)

## 2017-05-27 LAB — APTT: APTT: 31 s (ref 24–36)

## 2017-05-27 NOTE — ED Notes (Signed)
Per EDP, Dr. Ranae PalmsYelverton no code stroke at this time.

## 2017-05-27 NOTE — ED Notes (Signed)
Pt to CT

## 2017-05-27 NOTE — ED Provider Notes (Signed)
[facility]-EMERGENCY DEPT Provider Note   CSN: [id number] Arrival date & time: 05/27/17  1046     History   Chief Complaint Chief Complaint  Patient presents with  . Dizziness    HPI Sheena Cain is a 51 y.o. female.  HPI Patient states she was not feeling well while at work today. Her blood pressure checked and systolic was in the 170s. Developed some left lower facial numbness roughly 30 minutes prior to coming to the emergency department. States that numbness has improved though it is still present. No visual changes, speech changes, focal weakness. Was able to walk without difficulty. Denies recent fever chills. No urinary symptoms. No chest pain, shortness of breath, abdominal pain, vomiting or diarrhea. No blood in stool or vaginal bleeding. Past Medical History:  Diagnosis Date  . Degenerative disc disease, cervical   . Depression   . Hypertension   . Irregular heart beat   . Migraines   . Sleep apnea     Patient Active Problem List   Diagnosis Date Noted  . Adjustment disorder with mixed anxiety and depressed mood 03/28/2015  . Depression, major, recurrent, mild (HCC) 03/28/2015  . High blood pressure 03/28/2015  . Chronic arthritis 03/28/2015    Past Surgical History:  Procedure Laterality Date  . ABDOMINAL HYSTERECTOMY    . VAGINAL HYSTERECTOMY      OB History    Gravida Para Term Preterm AB Living   1         1   SAB TAB Ectopic Multiple Live Births                   Home Medications    Prior to Admission medications   Medication Sig Start Date End Date Taking? Authorizing Provider  omeprazole (PRILOSEC OTC) 20 MG tablet Take 20 mg by mouth daily.    [provider]  propranolol (INDERAL) 40 MG tablet Take 1 tablet (40 mg total) by mouth 2 (two) times daily. 05/29/17   Leftwich-Kirby, Wilmer Floor, CNM    Family History Family History  Problem Relation Age of Onset  . Hypertension Mother   . Hypertension Father     Social History Social  History  Substance Use Topics  . Smoking status: Never Smoker  . Smokeless tobacco: Never Used  . Alcohol use No     Allergies   Patient has no known allergies.   Review of Systems Review of Systems  Constitutional: Positive for fatigue. Negative for chills and fever.  HENT: Negative for sinus pain, sinus pressure, sore throat, trouble swallowing and voice change.   Eyes: Negative for visual disturbance.  Respiratory: Negative for cough, chest tightness and shortness of breath.   Cardiovascular: Negative for chest pain.  Gastrointestinal: Negative for abdominal pain, constipation, diarrhea, nausea and vomiting.  Genitourinary: Negative for dysuria, flank pain, frequency, hematuria, pelvic pain and vaginal bleeding.  Musculoskeletal: Negative for back pain, myalgias, neck pain and neck stiffness.  Skin: Negative for rash and wound.  Neurological: Positive for dizziness, light-headedness and numbness. Negative for syncope, weakness and headaches.  All other systems reviewed and are negative.    Physical Exam Updated Vital Signs BP 136/75   Pulse (!) 51   Temp 97.7 F (36.5 C)   Resp 18   Ht 5\' 5"  (1.651 m)   Wt 107 kg (236 lb)   SpO2 99%   BMI 39.27 kg/m   Physical Exam  Constitutional: She is oriented to person, place, and time. She  appears well-developed and well-nourished. No distress.  HENT:  Head: Normocephalic and atraumatic.  Mouth/Throat: Oropharynx is clear and moist. No oropharyngeal exudate.  Eyes: Pupils are equal, round, and reactive to light. EOM are normal.  Neck: Normal range of motion. Neck supple.  No meningismus. No posterior midline cervical tenderness to palpation.  Cardiovascular: Normal rate and regular rhythm.  Exam reveals no gallop and no friction rub.   No murmur heard. Pulmonary/Chest: Effort normal and breath sounds normal. No respiratory distress. She has no wheezes. She has no rales. She exhibits no tenderness.  Abdominal: Soft. Bowel  sounds are normal. There is no tenderness. There is no rebound and no guarding.  Musculoskeletal: Normal range of motion. She exhibits no edema or tenderness.  No midline thoracic or lumbar tenderness. No CVA tenderness. No lower extremity swelling, asymmetry or tenderness. Distal pulses are 2+.  Neurological: She is alert and oriented to person, place, and time.  Patient is alert and oriented x3 with clear, goal oriented speech. Patient has 5/5 motor in all extremities. Diminished sensation in the left lower face otherwise sensation intact to light touch. Bilateral finger-to-nose is normal with no signs of dysmetria. Patient has a normal gait and walks without assistance. Cranial nerves II through XII grossly intact  Skin: Skin is warm and dry. Capillary refill takes less than 2 seconds. No rash noted. No erythema.  Psychiatric: She has a normal mood and affect. Her behavior is normal.  Nursing note and vitals reviewed.    ED Treatments / Results  Labs (all labs ordered are listed, but only abnormal results are displayed) Labs Reviewed  CBC - Abnormal; Notable for the following:       Result Value   Hemoglobin 11.9 (*)    HCT 35.9 (*)    All other components within normal limits  COMPREHENSIVE METABOLIC PANEL - Abnormal; Notable for the following:    BUN 23 (*)    All other components within normal limits  URINALYSIS, ROUTINE W REFLEX MICROSCOPIC - Abnormal; Notable for the following:    Color, Urine STRAW (*)    All other components within normal limits  PROTIME-INR  APTT  DIFFERENTIAL  TROPONIN I  CBG MONITORING, ED  CBG MONITORING, ED    EKG  EKG Interpretation  Date/Time:  Wednesday May 27 2017 11:03:40 EDT Ventricular Rate:  69 PR Interval:    QRS Duration: 100 QT Interval:  403 QTC Calculation: 432 R Axis:   63 Text Interpretation:  Sinus rhythm Baseline wander in lead(s) I III aVR aVL V2 Baseline wander Confirmed by Corlis LeakMackuen, Courteney ([id number]) on 05/29/2017  1:12:10 PM       Radiology No results found.  Procedures Procedures (including critical care time)  Medications Ordered in ED Medications - No data to display   Initial Impression / Assessment and Plan / ED Course  I have reviewed the triage vital signs and the nursing notes.  Pertinent labs & imaging results that were available during my care of the patient were reviewed by me and considered in my medical decision making (see chart for details).     Patient with purely sensory changes to the left lower face. No other neurologic findings. Good strength was not activated. Will get CT head to rule out intracranial abnormality. Workup is essentially negative. Patient's symptoms have completely resolved. Advised follow-up with neurology. Return precautions given. Final Clinical Impressions(s) / ED Diagnoses   Final diagnoses:  Lt facial numbness    New Prescriptions Discharge Medication  List as of 05/27/2017  5:15 PM       Loren Racer, MD 05/29/17 1544

## 2017-05-27 NOTE — ED Notes (Signed)
EKG given to Dr. Yelverton 

## 2017-05-27 NOTE — ED Triage Notes (Addendum)
Also c/o tingling to left side of face. Times 30 minutes

## 2017-05-27 NOTE — ED Triage Notes (Signed)
Dizziness since yesterday.  B/p at work 179/92.

## 2017-05-28 ENCOUNTER — Inpatient Hospital Stay (HOSPITAL_COMMUNITY)
Admission: AD | Admit: 2017-05-28 | Discharge: 2017-05-29 | Disposition: A | Discharge status: Home / Self Care | Payer: BLUE CROSS/BLUE SHIELD | Source: Ambulatory Visit | Attending: Obstetrics & Gynecology | Admitting: Obstetrics & Gynecology

## 2017-05-28 ENCOUNTER — Encounter (HOSPITAL_COMMUNITY): Payer: Self-pay | Admitting: *Deleted

## 2017-05-28 DIAGNOSIS — R200 Anesthesia of skin: Secondary | ICD-10-CM | POA: Diagnosis not present

## 2017-05-28 DIAGNOSIS — G43009 Migraine without aura, not intractable, without status migrainosus: Secondary | ICD-10-CM | POA: Insufficient documentation

## 2017-05-28 DIAGNOSIS — G43001 Migraine without aura, not intractable, with status migrainosus: Secondary | ICD-10-CM | POA: Diagnosis not present

## 2017-05-28 MED ORDER — SUMATRIPTAN SUCCINATE 6 MG/0.5ML ~~LOC~~ SOLN
6.0000 mg | Freq: Once | SUBCUTANEOUS | Status: AC
  Administered 2017-05-28: 6 mg via SUBCUTANEOUS
  Filled 2017-05-28: qty 0.5

## 2017-05-28 NOTE — MAU Note (Signed)
Pt c/o migraine headache for the last week. Pt states she has taken ibuprofen at home with no relief. Pt states she was seen at ED yesterday and was worked up to make sure she wasn't having a stroke. Pt was told everything was normal. Pt states she is have numbness in the left side of her face that started yesterday. Pt c/o dizziness and nausea today. Pt states she hasn't taken any medication today due to the nausea.

## 2017-05-28 NOTE — MAU Provider Note (Signed)
History     CSN: [id number]  Arrival date and time: 05/28/17 2129   None     Chief Complaint  Patient presents with  . Headache   HPI Sheena Cain is a 51 y.o. female presenting for headache not managed by home ibuprofen. She has had the headache for the past, associated with left facial numbness. The pain became worse 8/10 and presented to the ED and had a detailed work for to r/o stroke. She was sent home. She returns with similar symptoms. She had nausea and vomiting unable to keep food down, but able to drink.  She has photophobia, tenderness to touch on her head.    Past Medical History:  Diagnosis Date  . Degenerative disc disease, cervical   . Depression   . Hypertension   . Irregular heart beat   . Migraines   . Sleep apnea     Past Surgical History:  Procedure Laterality Date  . ABDOMINAL HYSTERECTOMY    . VAGINAL HYSTERECTOMY      Family History  Problem Relation Age of Onset  . Hypertension Mother   . Hypertension Father     Social History  Substance Use Topics  . Smoking status: Never Smoker  . Smokeless tobacco: Never Used  . Alcohol use No    Allergies: No Known Allergies  Prescriptions Prior to Admission  Medication Sig Dispense Refill Last Dose  . Chlorphen-Phenyleph-ASA (ALKA-SELTZER PLUS COLD PO) Take 1 tablet by mouth 2 (two) times daily as needed (for cold symptoms).   Not Taking at Unknown time  . gabapentin (NEURONTIN) 600 MG tablet Take 600 mg by mouth 3 (three) times daily.   Not Taking at Unknown time  . HYDROcodone-acetaminophen (NORCO/VICODIN) 5-325 MG tablet Take 1 tablet by mouth every 4 (four) hours as needed. (Patient not taking: Reported on 05/27/2017) 20 tablet 0 Completed Course at Unknown time  . ibuprofen (ADVIL,MOTRIN) 800 MG tablet Take 1 tablet (800 mg total) by mouth 3 (three) times daily. 21 tablet 0 05/26/2017 at Unknown time  . metFORMIN (GLUCOPHAGE) 500 MG tablet Take 500 mg by mouth daily.    Not Taking at Unknown time   . methocarbamol (ROBAXIN) 500 MG tablet Take 1 tablet (500 mg total) by mouth 2 (two) times daily as needed for muscle spasms. (Patient not taking: Reported on 05/27/2017) 20 tablet 0 Completed Course at Unknown time  . metroNIDAZOLE (FLAGYL) 500 MG tablet Take 1 tablet (500 mg total) by mouth 2 (two) times daily. (Patient not taking: Reported on 05/27/2017) 14 tablet 0 Completed Course at Unknown time  . omeprazole (PRILOSEC OTC) 20 MG tablet Take 20 mg by mouth daily.   Not Taking at Unknown time  . ondansetron (ZOFRAN ODT) 4 MG disintegrating tablet Take 1 tablet (4 mg total) by mouth every 8 (eight) hours as needed for nausea or vomiting. (Patient not taking: Reported on 05/27/2017) 20 tablet 0 Completed Course at Unknown time  . traZODone (DESYREL) 50 MG tablet Take 50 mg by mouth at bedtime.   Not Taking at Unknown time  . verapamil (VERELAN PM) 240 MG 24 hr capsule Take 240 mg by mouth at bedtime.   Not Taking at Unknown time    Review of Systems  Constitutional: Negative for fever.  HENT:       Left facial numbness, tenderness over whole head.   Eyes: Positive for photophobia.  Respiratory: Negative for shortness of breath.   Gastrointestinal: Positive for nausea and vomiting.  Endocrine: Negative  for polyuria.  Musculoskeletal: Positive for back pain.  Neurological: Positive for numbness and headaches. Negative for speech difficulty and weakness.   Physical Exam   Blood pressure (!) 125/48, pulse 73, temperature 98.5 F (36.9 C), temperature source Oral, resp. rate 18, height 5\' 5"  (1.651 m), weight 107 kg (236 lb), SpO2 98 %.  Physical Exam  Constitutional: She is oriented to person, place, and time. She appears well-developed.  HENT:  Head: Normocephalic and atraumatic.  Tenderness to palpation  Pain worsened with tapping over occipital nerve and trigemenial nerve distribution  Eyes: EOM are normal.  Cardiovascular: Normal rate.   Irregular rhythem(hx of irregular beat, clean  stress test)  Respiratory: No respiratory distress.  Musculoskeletal: Normal range of motion.  Neurological: She is alert and oriented to person, place, and time. No cranial nerve deficit. She exhibits normal muscle tone. Coordination normal.  Skin: No rash noted.    MAU Course  Procedures   MDM   Assessment and Plan  Sheena Cain is a 51 y.o. female is presenting for Migraine without aura and with status migrainosus, not intractable  She has attempted ibuprofen with no success. She has not attempted other medications for headache abortive or  Prophylaxis.  Ddx: occipital neuralgia, trigeminal neuralgia, atypical migraine.  Imitrex 6mg  SQ once  Discharge with propanolol 40mg  BID one month supply f/u with PCP.  Refer to [facility]    Ignacia Marvel 05/28/2017, 10:40 PM   I have seen this patient and agree with the above resident's note.  MDM: Pt with long hx of h/a with similar symptoms prior to today's presentation.  Responded well to Imitrex dosing in MAU, plan to start on preventive medication, propranolol, and pt to f/u with [facility]. Return to emergency department for worsening symptoms.  Sharen Counter, CNM 12:48 AM

## 2017-05-29 DIAGNOSIS — G43001 Migraine without aura, not intractable, with status migrainosus: Secondary | ICD-10-CM

## 2017-05-29 MED ORDER — PROPRANOLOL HCL 40 MG PO TABS: 40.0000 mg | ORAL_TABLET | Freq: Two times a day (BID) | ORAL | 0 refills | Status: DC

## 2017-06-18 ENCOUNTER — Ambulatory Visit: Payer: BLUE CROSS/BLUE SHIELD | Admitting: Family Medicine

## 2017-12-05 ENCOUNTER — Emergency Department (HOSPITAL_COMMUNITY): Payer: Managed Care, Other (non HMO)

## 2017-12-05 ENCOUNTER — Encounter (HOSPITAL_COMMUNITY): Payer: Self-pay | Admitting: *Deleted

## 2017-12-05 ENCOUNTER — Emergency Department (HOSPITAL_COMMUNITY)
Admission: EM | Admit: 2017-12-05 | Discharge: 2017-12-05 | Disposition: A | Discharge status: Home / Self Care | Payer: Managed Care, Other (non HMO) | Attending: Emergency Medicine | Admitting: Emergency Medicine

## 2017-12-05 ENCOUNTER — Other Ambulatory Visit: Payer: Self-pay

## 2017-12-05 DIAGNOSIS — Y999 Unspecified external cause status: Secondary | ICD-10-CM | POA: Insufficient documentation

## 2017-12-05 DIAGNOSIS — Y929 Unspecified place or not applicable: Secondary | ICD-10-CM | POA: Insufficient documentation

## 2017-12-05 DIAGNOSIS — M25511 Pain in right shoulder: Secondary | ICD-10-CM | POA: Diagnosis present

## 2017-12-05 DIAGNOSIS — X58XXXA Exposure to other specified factors, initial encounter: Secondary | ICD-10-CM | POA: Insufficient documentation

## 2017-12-05 DIAGNOSIS — S46911A Strain of unspecified muscle, fascia and tendon at shoulder and upper arm level, right arm, initial encounter: Secondary | ICD-10-CM | POA: Insufficient documentation

## 2017-12-05 DIAGNOSIS — I10 Essential (primary) hypertension: Secondary | ICD-10-CM | POA: Diagnosis not present

## 2017-12-05 DIAGNOSIS — Y939 Activity, unspecified: Secondary | ICD-10-CM | POA: Insufficient documentation

## 2017-12-05 MED ORDER — CYCLOBENZAPRINE HCL 10 MG PO TABS: 10.0000 mg | ORAL_TABLET | Freq: Three times a day (TID) | ORAL | 0 refills | Status: DC | PRN

## 2017-12-05 MED ORDER — IBUPROFEN 400 MG PO TABS
400.0000 mg | ORAL_TABLET | Freq: Once | ORAL | Status: AC
Start: 2017-12-05 — End: 2017-12-05
  Administered 2017-12-05: 400 mg via ORAL
  Filled 2017-12-05: qty 1

## 2017-12-05 NOTE — ED Triage Notes (Signed)
Pt states she had some slight shoulder pain yesterday & about 30 minutes ago turned over in bed & started having sharp pains in her right shoulder.

## 2017-12-05 NOTE — ED Provider Notes (Signed)
[facility] EMERGENCY DEPARTMENT Provider Note   CSN: [id number] Arrival date & time: 12/05/17  0450     History   Chief Complaint Chief Complaint  Patient presents with  . Shoulder Pain    HPI Sheena Cain is a 52 y.o. female.  The history is provided by the patient.  Shoulder Pain   This is a new problem. The current episode started yesterday. The problem occurs constantly. The problem has been gradually worsening. Pain location: Right scapula. The quality of the pain is described as sharp. The pain is moderate. Associated symptoms include limited range of motion. The symptoms are aggravated by activity. She has tried nothing for the symptoms. There has been no history of extremity trauma.   Patient reports onset of shoulder pain.  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  She woke up tonight after moving in the bed with worsening pain around her right scapula.  It is worse with palpation as well as movement.  Also reports it is worse with breathing.  No shortness of breath or hemoptysis.  She also reports brief episodes of right-sided chest pain at times with deep breathing.  She denies any trauma.  Reports a history of irregular heartbeat, but no other known cardiac or lung disease No history of VTE Past Medical History:  Diagnosis Date  . Degenerative disc disease, cervical   . Depression   . Hypertension   . Irregular heart beat   . Migraines   . Sleep apnea     Patient Active Problem List   Diagnosis Date Noted  . Adjustment disorder with mixed anxiety and depressed mood 03/28/2015  . Depression, major, recurrent, mild (HCC) 03/28/2015  . High blood pressure 03/28/2015  . Chronic arthritis 03/28/2015    Past Surgical History:  Procedure Laterality Date  . ABDOMINAL HYSTERECTOMY    . VAGINAL HYSTERECTOMY      OB History    Gravida Para Term Preterm AB Living   1         1   SAB TAB Ectopic Multiple Live Births                     Home Medications    Prior to Admission medications   Medication Sig Start Date End Date Taking? Authorizing Provider  omeprazole (PRILOSEC OTC) 20 MG tablet Take 20 mg by mouth daily.    [provider]  propranolol (INDERAL) 40 MG tablet Take 1 tablet (40 mg total) by mouth 2 (two) times daily. 05/29/17   Leftwich-Kirby, Wilmer Floor, CNM    Family History Family History  Problem Relation Age of Onset  . Hypertension Mother   . Hypertension Father     Social History Social History   Tobacco Use  . Smoking status: Never Smoker  . Smokeless tobacco: Never Used  Substance Use Topics  . Alcohol use: No  . Drug use: No     Allergies   Patient has no known allergies.   Review of Systems Review of Systems  HENT: Negative for sore throat.   Respiratory: Negative for cough and shortness of breath.   Musculoskeletal: Positive for arthralgias. Negative for neck pain.  Neurological: Negative for weakness.  All other systems reviewed and are negative.    Physical Exam Updated Vital Signs BP (!) 139/109 (BP Location: Left Arm)   Pulse 88   Temp 97.8 F (36.6 C) (Oral)   Resp 18  Ht 1.702 m (5\' 7" )   Wt 104.3 kg (230 lb)   SpO2 99%   BMI 36.02 kg/m   Physical Exam CONSTITUTIONAL: Well developed/well nourished HEAD: Normocephalic/atraumatic EYES: EOMI  ENMT: Mucous membranes moist NECK: supple no meningeal signs SPINE/BACK:entire spine nontender CV: irregular, no murmurs/rubs/gallops noted LUNGS: Lungs are clear to auscultation bilaterally, no apparent distress ABDOMEN: soft, nontender, no rebound or guarding, bowel sounds noted throughout abdomen GU:no cva tenderness NEURO: Pt is awake/alert/appropriate, moves all extremitiesx4.  No facial droop.  Equal handgrips noted.  No focal weakness noted in upper extremities. EXTREMITIES: pulses normal/equal, full ROM There is tenderness around the right scapula.  There is no erythema, no  bruising.  It is worsened with movement.  She is able to range the right shoulder without difficulty. There is no anterior chest wall tenderness. SKIN: warm, color normal PSYCH: no abnormalities of mood noted, alert and oriented to situation   ED Treatments / Results  Labs (all labs ordered are listed, but only abnormal results are displayed) Labs Reviewed - No data to display  EKG  EKG Interpretation  Date/Time:  Saturday December 05 2017 05:50:08 EST Ventricular Rate:  60 PR Interval:    QRS Duration: 105 QT Interval:  441 QTC Calculation: 441 R Axis:   40 Text Interpretation:  Sinus rhythm Ventricular trigeminy Baseline wander in lead(s) III V2 Confirmed by Zadie RhineWickline, Krislynn Gronau ([id number]) on 12/05/2017 5:56:18 AM       Radiology Dg Chest 2 View  Result Date: 12/05/2017 CLINICAL DATA:  Shoulder pain beginning yesterday. EXAM: CHEST  2 VIEW COMPARISON:  None. FINDINGS: The cardiac silhouette is mildly enlarged. Mediastinal silhouette is not suspicious. No pleural effusion or focal consolidation. No pneumothorax. Mild degenerative change of the thoracic spine. IMPRESSION: Mild cardiomegaly.  No acute pulmonary process. Electronically Signed   By: Awilda Metroourtnay  Bloomer M.D.   On: 12/05/2017 06:21    Procedures Procedures   Medications Ordered in ED Medications  ibuprofen (ADVIL,MOTRIN) tablet 400 mg (400 mg Oral Given 12/05/17 0557)     Initial Impression / Assessment and Plan / ED Course  I have reviewed the triage vital signs and the nursing notes.  Pertinent  imaging results that were available during my care of the patient were reviewed by me and considered in my medical decision making (see chart for details).     6:59 AM EKG unchanged, chest x-ray unremarkable Patient resting comfortably.  She denies any dyspnea on exertion, denies any shortness of breath. She denies any chest pain at this time.  Reports all of her pain is in her right scapula with movement and palpation.   My suspicion for ACS/PE/dissection is low. Advise rest, muscle relaxants, heat therapy.  We discussed strict return precautions  Final Clinical Impressions(s) / ED Diagnoses   Final diagnoses:  Strain of right shoulder, initial encounter    ED Discharge Orders        Ordered    cyclobenzaprine (FLEXERIL) 10 MG tablet  3 times daily PRN     12/05/17 0655       Zadie RhineWickline, Bronsyn Shappell, MD 12/05/17 0700

## 2018-07-15 ENCOUNTER — Emergency Department (HOSPITAL_COMMUNITY)
Admission: EM | Admit: 2018-07-15 | Discharge: 2018-07-15 | Disposition: A | Discharge status: Home / Self Care | Payer: Managed Care, Other (non HMO) | Attending: Emergency Medicine | Admitting: Emergency Medicine

## 2018-07-15 ENCOUNTER — Encounter (HOSPITAL_COMMUNITY): Payer: Self-pay | Admitting: *Deleted

## 2018-07-15 ENCOUNTER — Other Ambulatory Visit: Payer: Self-pay

## 2018-07-15 ENCOUNTER — Emergency Department (HOSPITAL_COMMUNITY): Payer: Managed Care, Other (non HMO)

## 2018-07-15 DIAGNOSIS — R51 Headache: Secondary | ICD-10-CM | POA: Diagnosis not present

## 2018-07-15 DIAGNOSIS — N390 Urinary tract infection, site not specified: Secondary | ICD-10-CM | POA: Diagnosis not present

## 2018-07-15 DIAGNOSIS — R519 Headache, unspecified: Secondary | ICD-10-CM

## 2018-07-15 DIAGNOSIS — Z79899 Other long term (current) drug therapy: Secondary | ICD-10-CM | POA: Diagnosis not present

## 2018-07-15 DIAGNOSIS — I10 Essential (primary) hypertension: Secondary | ICD-10-CM | POA: Diagnosis not present

## 2018-07-15 LAB — COMPREHENSIVE METABOLIC PANEL
ALBUMIN: 3.6 g/dL (ref 3.5–5.0)
ALT: 17 U/L (ref 0–44)
ANION GAP: 7 (ref 5–15)
AST: 15 U/L (ref 15–41)
Alkaline Phosphatase: 44 U/L (ref 38–126)
BUN: 17 mg/dL (ref 6–20)
CHLORIDE: 105 mmol/L (ref 98–111)
CO2: 27 mmol/L (ref 22–32)
Calcium: 8.9 mg/dL (ref 8.9–10.3)
Creatinine, Ser: 0.7 mg/dL (ref 0.44–1.00)
GFR calc Af Amer: >60 mL/min (ref >60)
GFR calc non Af Amer: >60 mL/min (ref >60)
Glucose, Bld: 97 mg/dL (ref 70–99)
POTASSIUM: 3.5 mmol/L (ref 3.5–5.1)
Sodium: 139 mmol/L (ref 135–145)
Total Bilirubin: 0.5 mg/dL (ref 0.3–1.2)
Total Protein: 6.6 g/dL (ref 6.5–8.1)

## 2018-07-15 LAB — CBC WITH DIFFERENTIAL/PLATELET
BASOS ABS: 0 10*3/uL (ref 0.0–0.1)
BASOS PCT: 0 %
EOS PCT: 1 %
Eosinophils Absolute: 0.1 10*3/uL (ref 0.0–0.7)
HCT: 36.7 % (ref 36.0–46.0)
Hemoglobin: 11.8 g/dL — ABNORMAL LOW (ref 12.0–15.0)
Lymphocytes Relative: 46 %
Lymphs Abs: 3.1 10*3/uL (ref 0.7–4.0)
MCH: 27.1 pg (ref 26.0–34.0)
MCHC: 32.2 g/dL (ref 30.0–36.0)
MCV: 84.2 fL (ref 78.0–100.0)
MONO ABS: 0.6 10*3/uL (ref 0.1–1.0)
Monocytes Relative: 9 %
Neutro Abs: 3.1 10*3/uL (ref 1.7–7.7)
Neutrophils Relative %: 44 %
PLATELETS: 246 10*3/uL (ref 150–400)
RBC: 4.36 MIL/uL (ref 3.87–5.11)
RDW: 14.2 % (ref 11.5–15.5)
WBC: 6.9 10*3/uL (ref 4.0–10.5)

## 2018-07-15 LAB — URINALYSIS, ROUTINE W REFLEX MICROSCOPIC
Bilirubin Urine: NEGATIVE
Glucose, UA: NEGATIVE mg/dL
KETONES UR: NEGATIVE mg/dL
NITRITE: NEGATIVE
PROTEIN: NEGATIVE mg/dL
Specific Gravity, Urine: 1.016 (ref 1.005–1.030)
pH: 5 (ref 5.0–8.0)

## 2018-07-15 LAB — LIPASE, BLOOD: LIPASE: 33 U/L (ref 11–51)

## 2018-07-15 LAB — TROPONIN I

## 2018-07-15 MED ORDER — SODIUM CHLORIDE 0.9 % IV BOLUS
1000.0000 mL | Freq: Once | INTRAVENOUS | Status: AC
  Administered 2018-07-15: 1000 mL via INTRAVENOUS

## 2018-07-15 MED ORDER — ONDANSETRON 4 MG PO TBDP
4.0000 mg | ORAL_TABLET | Freq: Once | ORAL | Status: AC
Start: 2018-07-15 — End: 2018-07-15
  Administered 2018-07-15: 4 mg via ORAL
  Filled 2018-07-15: qty 1

## 2018-07-15 MED ORDER — CEPHALEXIN 500 MG PO CAPS: 500.0000 mg | ORAL_CAPSULE | Freq: Three times a day (TID) | ORAL | 0 refills | Status: DC

## 2018-07-15 MED ORDER — ONDANSETRON HCL 4 MG/2ML IJ SOLN
4.0000 mg | Freq: Once | INTRAMUSCULAR | Status: AC
  Administered 2018-07-15: 4 mg via INTRAVENOUS
  Filled 2018-07-15: qty 2

## 2018-07-15 MED ORDER — ONDANSETRON 4 MG PO TBDP: 4.0000 mg | ORAL_TABLET | Freq: Three times a day (TID) | ORAL | 0 refills | Status: DC | PRN

## 2018-07-15 MED ORDER — IBUPROFEN 800 MG PO TABS
800.0000 mg | ORAL_TABLET | Freq: Once | ORAL | Status: AC
  Administered 2018-07-15: 800 mg via ORAL
  Filled 2018-07-15: qty 1

## 2018-07-15 MED ORDER — CEPHALEXIN 500 MG PO CAPS
500.0000 mg | ORAL_CAPSULE | Freq: Once | ORAL | Status: AC
  Administered 2018-07-15: 500 mg via ORAL
  Filled 2018-07-15: qty 1

## 2018-07-15 MED ORDER — PROMETHAZINE HCL 12.5 MG PO TABS
25.0000 mg | ORAL_TABLET | Freq: Once | ORAL | Status: AC
  Administered 2018-07-15: 25 mg via ORAL
  Filled 2018-07-15: qty 2

## 2018-07-15 NOTE — Discharge Instructions (Addendum)
Keep yourself hydrated.  Take the antibiotics as prescribed for urinary tract infection.  Establish care with a primary care doctor.  Return to the ED if you develop new or worsening symptoms.

## 2018-07-15 NOTE — ED Provider Notes (Signed)
[facility] EMERGENCY DEPARTMENT Provider Note   CSN: [id number] Arrival date & time: 07/15/18  4098     History   Chief Complaint Chief Complaint  Patient presents with  . Headache    HPI Sheena Cain is a 52 y.o. female.  Patient presents with frontal headache gradual onset since yesterday.  She reports history of headaches similar to this that she has called migraines but never been diagnosed with a migraine by a physician.  This headache is similar to her chronic headache but is associated with persistent nausea which is unusual.  She has not had any vomiting or diarrhea.  Denies thunderclap onset.  No fever, chills, vomiting.  No focal weakness, numbness or tingling.  No difficulty speaking or difficulty swallowing.  Denies any chest pain, abdominal pain, shortness of breath.  No visual changes.  No photophobia or phonophobia.  Does not take any blood thinners.  No head trauma.  The history is provided by the patient.  Headache   Associated symptoms include nausea. Pertinent negatives include no fever, no shortness of breath and no vomiting.    Past Medical History:  Diagnosis Date  . Degenerative disc disease, cervical   . Depression   . Hypertension   . Irregular heart beat   . Migraines   . Sleep apnea     Patient Active Problem List   Diagnosis Date Noted  . Adjustment disorder with mixed anxiety and depressed mood 03/28/2015  . Depression, major, recurrent, mild (HCC) 03/28/2015  . High blood pressure 03/28/2015  . Chronic arthritis 03/28/2015    Past Surgical History:  Procedure Laterality Date  . ABDOMINAL HYSTERECTOMY    . VAGINAL HYSTERECTOMY       OB History    Gravida  1   Para      Term      Preterm      AB      Living  1     SAB      TAB      Ectopic      Multiple      Live Births               Home Medications    Prior to Admission medications   Medication Sig Start Date End Date Taking? Authorizing Provider    cyclobenzaprine (FLEXERIL) 10 MG tablet Take 1 tablet (10 mg total) by mouth 3 (three) times daily as needed for muscle spasms. 12/05/17   Zadie Rhine, MD  omeprazole (PRILOSEC OTC) 20 MG tablet Take 20 mg by mouth daily.    [provider]  propranolol (INDERAL) 40 MG tablet Take 1 tablet (40 mg total) by mouth 2 (two) times daily. 05/29/17   Leftwich-Kirby, Wilmer Floor, CNM    Family History Family History  Problem Relation Age of Onset  . Hypertension Mother   . Hypertension Father     Social History Social History   Tobacco Use  . Smoking status: Never Smoker  . Smokeless tobacco: Never Used  Substance Use Topics  . Alcohol use: No  . Drug use: No     Allergies   Patient has no known allergies.   Review of Systems Review of Systems  Constitutional: Negative for activity change, appetite change, fatigue and fever.  HENT: Negative for congestion and rhinorrhea.   Eyes: Negative for photophobia and visual disturbance.  Respiratory: Negative for cough, chest tightness, shortness of breath and wheezing.   Cardiovascular: Negative for chest pain.  Gastrointestinal:  Positive for nausea. Negative for abdominal pain, diarrhea and vomiting.  Genitourinary: Negative for dysuria, flank pain, hematuria, vaginal bleeding and vaginal discharge.  Musculoskeletal: Negative for arthralgias, myalgias, neck pain and neck stiffness.  Neurological: Positive for headaches.   all other systems are negative except as noted in the HPI and PMH.     Physical Exam Updated Vital Signs BP (!) 145/60   Pulse 64   Temp 97.9 F (36.6 C) (Oral)   Ht 5' 6.5" (1.689 m)   Wt 104.3 kg   SpO2 98%   BMI 36.57 kg/m   Physical Exam  Constitutional: She is oriented to person, place, and time. She appears well-developed and well-nourished. No distress.  HENT:  Head: Normocephalic and atraumatic.  Mouth/Throat: Oropharynx is clear and moist. No oropharyngeal exudate.  No frontal maxillary  sinus tenderness, no temporal artery tenderness  Eyes: Pupils are equal, round, and reactive to light. Conjunctivae and EOM are normal.  No photophobia  Neck: Normal range of motion. Neck supple.  No meningismus.  Cardiovascular: Normal rate, regular rhythm, normal heart sounds and intact distal pulses.  No murmur heard. Pulmonary/Chest: Effort normal and breath sounds normal. No respiratory distress. She exhibits no tenderness.  Abdominal: Soft. There is no tenderness. There is no rebound and no guarding.  Musculoskeletal: Normal range of motion. She exhibits no edema or tenderness.  Neurological: She is alert and oriented to person, place, and time. No cranial nerve deficit. She exhibits normal muscle tone. Coordination normal.  CN 2-12 intact, no ataxia on finger to nose, no nystagmus, 5/5 strength throughout, no pronator drift, Romberg negative, normal gait.   Skin: Skin is warm. Capillary refill takes less than 2 seconds.  Psychiatric: She has a normal mood and affect. Her behavior is normal.  Nursing note and vitals reviewed.    ED Treatments / Results  Labs (all labs ordered are listed, but only abnormal results are displayed) Labs Reviewed  URINALYSIS, ROUTINE W REFLEX MICROSCOPIC - Abnormal; Notable for the following components:      Result Value   APPearance HAZY (*)    Hgb urine dipstick SMALL (*)    Leukocytes, UA LARGE (*)    Bacteria, UA FEW (*)    All other components within normal limits  CBC WITH DIFFERENTIAL/PLATELET - Abnormal; Notable for the following components:   Hemoglobin 11.8 (*)    All other components within normal limits  COMPREHENSIVE METABOLIC PANEL  LIPASE, BLOOD  TROPONIN I    EKG EKG Interpretation  Date/Time:  Thursday July 15 2018 06:45:53 EDT Ventricular Rate:  49 PR Interval:    QRS Duration: 116 QT Interval:  459 QTC Calculation: 415 R Axis:   74 Text Interpretation:  Sinus bradycardia Nonspecific intraventricular conduction  delay No significant change was found Confirmed by Glynn Octave [phone]) on 07/15/2018 6:51:43 AM   Radiology Ct Head Wo Contrast  Result Date: 07/15/2018 CLINICAL DATA:  52 year old female with chronic headache and nausea. EXAM: CT HEAD WITHOUT CONTRAST TECHNIQUE: Contiguous axial images were obtained from the base of the skull through the vertex without intravenous contrast. COMPARISON:  Head CT dated 05/27/2017 FINDINGS: Brain: No evidence of acute infarction, hemorrhage, hydrocephalus, extra-axial collection or mass lesion/mass effect. Vascular: No hyperdense vessel or unexpected calcification. Skull: Normal. Negative for fracture or focal lesion. Sinuses/Orbits: Slight dysconjugate gaze. Clinical correlation is recommended. The visualized paranasal sinuses and mastoid air cells are clear. Other: None. IMPRESSION: Unremarkable noncontrast CT of the brain. Electronically Signed   By: Burtis Junes  Radparvar M.D.   On: 07/15/2018 05:15    Procedures Procedures (including critical care time)  Medications Ordered in ED Medications  ondansetron (ZOFRAN-ODT) disintegrating tablet 4 mg (has no administration in time range)  ibuprofen (ADVIL,MOTRIN) tablet 800 mg (has no administration in time range)     Initial Impression / Assessment and Plan / ED Course  I have reviewed the triage vital signs and the nursing notes.  Pertinent labs & imaging results that were available during my care of the patient were reviewed by me and considered in my medical decision making (see chart for details).    Gradual onset frontal headache similar to previous.  Associated with nausea but no vomiting.  No focal neurological deficits.  No fever.  Low suspicion for subarachnoid hemorrhage, meningitis, temporal arteritis. Declines IM or IV medications.   CT head is negative.  6 AM.  Patient states headache has resolved but nausea persists and was not responsive to Zofran or Phenergan.  She continues to deny  abdominal pain or chest pain.  No vomiting or diarrhea. She is agreeable additional work-up to investigate other causes of nausea.  EKG is sinus rhythm without change.   IVF and IV zofran given. Labs reassuring with negative troponin UA consistent with infection. This is likely contributing to her nausea. This will be treated.  Feels improved with treatment. Tolerating PO and ambulatory. Followup with PCP. Return precautions discussed.  Final Clinical Impressions(s) / ED Diagnoses   Final diagnoses:  Headache, unspecified headache type  Urinary tract infection without hematuria, site unspecified    ED Discharge Orders    None       Brasen Bundren, Jeannett Senior, MD 07/15/18 [phone]

## 2018-07-15 NOTE — ED Triage Notes (Signed)
Pt c/o headache with nausea that started yesterday afternoon; pt states she woke up with the headache and it has gotten progressively worse

## 2019-12-15 ENCOUNTER — Ambulatory Visit: Payer: Managed Care, Other (non HMO) | Attending: Internal Medicine

## 2019-12-15 ENCOUNTER — Other Ambulatory Visit: Payer: Self-pay

## 2019-12-15 DIAGNOSIS — Z20822 Contact with and (suspected) exposure to covid-19: Secondary | ICD-10-CM

## 2019-12-16 LAB — NOVEL CORONAVIRUS, NAA: SARS-CoV-2, NAA: NOT DETECTED

## 2020-03-04 ENCOUNTER — Other Ambulatory Visit: Payer: Self-pay

## 2020-03-04 ENCOUNTER — Emergency Department (HOSPITAL_COMMUNITY)
Admission: EM | Admit: 2020-03-04 | Discharge: 2020-03-04 | Disposition: A | Discharge status: Home / Self Care | Payer: Managed Care, Other (non HMO) | Attending: Emergency Medicine | Admitting: Emergency Medicine

## 2020-03-04 ENCOUNTER — Encounter (HOSPITAL_COMMUNITY): Payer: Self-pay | Admitting: Emergency Medicine

## 2020-03-04 DIAGNOSIS — I10 Essential (primary) hypertension: Secondary | ICD-10-CM | POA: Insufficient documentation

## 2020-03-04 DIAGNOSIS — S50861A Insect bite (nonvenomous) of right forearm, initial encounter: Secondary | ICD-10-CM | POA: Diagnosis not present

## 2020-03-04 DIAGNOSIS — Y939 Activity, unspecified: Secondary | ICD-10-CM | POA: Insufficient documentation

## 2020-03-04 DIAGNOSIS — W57XXXA Bitten or stung by nonvenomous insect and other nonvenomous arthropods, initial encounter: Secondary | ICD-10-CM | POA: Diagnosis not present

## 2020-03-04 DIAGNOSIS — S59911A Unspecified injury of right forearm, initial encounter: Secondary | ICD-10-CM | POA: Diagnosis present

## 2020-03-04 DIAGNOSIS — Y999 Unspecified external cause status: Secondary | ICD-10-CM | POA: Diagnosis not present

## 2020-03-04 DIAGNOSIS — Y929 Unspecified place or not applicable: Secondary | ICD-10-CM | POA: Insufficient documentation

## 2020-03-04 MED ORDER — DOXYCYCLINE HYCLATE 100 MG PO CAPS: 100.0000 mg | ORAL_CAPSULE | Freq: Two times a day (BID) | ORAL | 0 refills | Status: DC

## 2020-03-04 NOTE — ED Triage Notes (Signed)
Pt reports bite 2 weeks ago   However, just found it

## 2020-03-04 NOTE — ED Provider Notes (Signed)
[facility] EMERGENCY DEPARTMENT Provider Note   CSN: [id number] Arrival date & time: 03/04/20  1123     History Chief Complaint  Patient presents with  . Insect Bite    Sheena Cain is a 54 y.o. female with no contributing past medical history presents emergency department with chief complaint of flulike symptoms.  Patient was bitten by a tick on the posterior right arm about 2 weeks ago.  Since that time she is started developing body aches, chills, fatigue, subjective fevers.  She states that she has had tach fever in the past and feels like she may have it again.  She denies urinary symptoms, abdominal pain, vomiting, sore throat, cough, exposure to Covid.  HPI     Past Medical History:  Diagnosis Date  . Degenerative disc disease, cervical   . Depression   . Hypertension   . Irregular heart beat   . Migraines   . Sleep apnea     Patient Active Problem List   Diagnosis Date Noted  . Adjustment disorder with mixed anxiety and depressed mood 03/28/2015  . Depression, major, recurrent, mild (HCC) 03/28/2015  . High blood pressure 03/28/2015  . Chronic arthritis 03/28/2015    Past Surgical History:  Procedure Laterality Date  . ABDOMINAL HYSTERECTOMY    . VAGINAL HYSTERECTOMY       OB History    Gravida  1   Para      Term      Preterm      AB      Living  1     SAB      TAB      Ectopic      Multiple      Live Births              Family History  Problem Relation Age of Onset  . Hypertension Mother   . Hypertension Father     Social History   Tobacco Use  . Smoking status: Never Smoker  . Smokeless tobacco: Never Used  Substance Use Topics  . Alcohol use: No  . Drug use: No    Home Medications Prior to Admission medications   Medication Sig Start Date End Date Taking? Authorizing Provider  cephALEXin (KEFLEX) 500 MG capsule Take 1 capsule (500 mg total) by mouth 3 (three) times daily. 07/15/18   Rancour, Jeannett Senior, MD    cyclobenzaprine (FLEXERIL) 10 MG tablet Take 1 tablet (10 mg total) by mouth 3 (three) times daily as needed for muscle spasms. 12/05/17   Zadie Rhine, MD  omeprazole (PRILOSEC OTC) 20 MG tablet Take 20 mg by mouth daily.    [provider]  ondansetron (ZOFRAN ODT) 4 MG disintegrating tablet Take 1 tablet (4 mg total) by mouth every 8 (eight) hours as needed for nausea or vomiting. 07/15/18   Rancour, Jeannett Senior, MD  propranolol (INDERAL) 40 MG tablet Take 1 tablet (40 mg total) by mouth 2 (two) times daily. 05/29/17   Leftwich-Kirby, Wilmer Floor, CNM    Allergies    Patient has no known allergies.  Review of Systems   Review of Systems Ten systems reviewed and are negative for acute change, except as noted in the HPI.   Physical Exam Updated Vital Signs BP (!) 146/88 (BP Location: Right Arm)   Pulse 72   Temp 97.8 F (36.6 C) (Oral)   Resp 18   Ht 5\' 6"  (1.676 m)   Wt 106.6 kg   SpO2 100%  BMI 37.93 kg/m   Physical Exam Physical Exam  Nursing note and vitals reviewed. Constitutional: She is oriented to person, place, and time. She appears well-developed and well-nourished. No distress.  HENT:  Head: Normocephalic and atraumatic.  Eyes: Conjunctivae normal and EOM are normal. Pupils are equal, round, and reactive to light. No scleral icterus.  Neck: Normal range of motion.  Cardiovascular: Normal rate, regular rhythm and normal heart sounds.  Exam reveals no gallop and no friction rub.   No murmur heard. Pulmonary/Chest: Effort normal and breath sounds normal. No respiratory distress.  Abdominal: Soft. Bowel sounds are normal. She exhibits no distension and no mass. There is no tenderness. There is no guarding.  Neurological: She is alert and oriented to person, place, and time.  Skin: Skin is warm and dry. She is not diaphoretic.    ED Results / Procedures / Treatments   Labs (all labs ordered are listed, but only abnormal results are displayed) Labs Reviewed - No  data to display  EKG None  Radiology No results found.  Procedures Procedures (including critical care time)  Medications Ordered in ED Medications - No data to display  ED Course  I have reviewed the triage vital signs and the nursing notes.  Pertinent labs & imaging results that were available during my care of the patient were reviewed by me and considered in my medical decision making (see chart for details).    MDM Rules/Calculators/A&P                      Patient here with complaint of tick bite subjective flulike symptoms.  Will discharge with doxycycline.  Discussed return precautions and outpatient follow-up.  No evidence of cellulitis. Final Clinical Impression(s) / ED Diagnoses Final diagnoses:  None    Rx / DC Orders ED Discharge Orders    None       Margarita Mail, PA-C 03/04/20 1210    Fredia Sorrow, MD 03/08/20 [phone]

## 2020-03-04 NOTE — Discharge Instructions (Addendum)
Contact a health care provider if: You have redness, swelling, or pain in the bite area. You have fluid or blood coming from the bite area. The bite area feels warm to the touch. You have pus or a bad smell coming from the bite area. You have a fever. Get help right away if: You have joint pain. You have a rash. You feel unusually tired or sleepy. You have neck pain. You have a headache. You have unusual weakness. You develop symptoms of an anaphylactic reaction. These may include: Flushed skin. Hives. Swelling of the eyes, lips, face, mouth, tongue, or throat. Difficulty breathing, speaking, or swallowing. Wheezing. Dizziness or light-headedness. Fainting. Pain or cramping in the abdomen. Vomiting. Diarrhea.

## 2020-03-04 NOTE — ED Triage Notes (Signed)
"  I think I may have tick fever"  Found tick on R arm this morning   No PCP- will need referral

## 2020-06-30 ENCOUNTER — Emergency Department (HOSPITAL_COMMUNITY)
Admission: EM | Admit: 2020-06-30 | Discharge: 2020-06-30 | Disposition: A | Discharge status: Home / Self Care | Payer: Managed Care, Other (non HMO) | Attending: Emergency Medicine | Admitting: Emergency Medicine

## 2020-06-30 ENCOUNTER — Other Ambulatory Visit: Payer: Self-pay

## 2020-06-30 ENCOUNTER — Encounter (HOSPITAL_COMMUNITY): Payer: Self-pay

## 2020-06-30 DIAGNOSIS — R519 Headache, unspecified: Secondary | ICD-10-CM | POA: Diagnosis present

## 2020-06-30 DIAGNOSIS — Z20822 Contact with and (suspected) exposure to covid-19: Secondary | ICD-10-CM | POA: Diagnosis not present

## 2020-06-30 DIAGNOSIS — Z79899 Other long term (current) drug therapy: Secondary | ICD-10-CM | POA: Diagnosis not present

## 2020-06-30 DIAGNOSIS — R030 Elevated blood-pressure reading, without diagnosis of hypertension: Secondary | ICD-10-CM

## 2020-06-30 DIAGNOSIS — I10 Essential (primary) hypertension: Secondary | ICD-10-CM | POA: Insufficient documentation

## 2020-06-30 DIAGNOSIS — R110 Nausea: Secondary | ICD-10-CM | POA: Insufficient documentation

## 2020-06-30 LAB — SARS CORONAVIRUS 2 BY RT PCR (HOSPITAL ORDER, PERFORMED IN ~~LOC~~ HOSPITAL LAB): SARS Coronavirus 2: NEGATIVE

## 2020-06-30 MED ORDER — ACETAMINOPHEN 500 MG PO TABS: 1000.0000 mg | ORAL_TABLET | Freq: Once | ORAL | Status: DC

## 2020-06-30 NOTE — ED Provider Notes (Signed)
[facility] EMERGENCY DEPARTMENT Provider Note   CSN: [id number] Arrival date & time: 06/30/20  0805     History Chief Complaint  Patient presents with  . Headache  . Nausea    Sheena Cain is a 54 y.o. female.  Patient presents for covid testing, indicating two family members who live in same home recently tested positive. Patient has previously been vaccinated for covid. Mild dull, frontal headache in past 1-2 days, but otherwise feels normal/well. No abrupt or severe headaches. No neck pain or stiffness. No cough or sob. No chest pain. No vomiting or diarrhea.   The history is provided by the patient.  Headache Associated symptoms: no cough, no fever, no neck pain, no neck stiffness and no vomiting        Past Medical History:  Diagnosis Date  . Degenerative disc disease, cervical   . Depression   . Hypertension   . Irregular heart beat   . Migraines   . Sleep apnea     Patient Active Problem List   Diagnosis Date Noted  . Adjustment disorder with mixed anxiety and depressed mood 03/28/2015  . Depression, major, recurrent, mild (HCC) 03/28/2015  . High blood pressure 03/28/2015  . Chronic arthritis 03/28/2015    Past Surgical History:  Procedure Laterality Date  . ABDOMINAL HYSTERECTOMY    . VAGINAL HYSTERECTOMY       OB History    Gravida  1   Para      Term      Preterm      AB      Living  1     SAB      TAB      Ectopic      Multiple      Live Births              Family History  Problem Relation Age of Onset  . Hypertension Mother   . Hypertension Father     Social History   Tobacco Use  . Smoking status: Never Smoker  . Smokeless tobacco: Never Used  Vaping Use  . Vaping Use: Never used  Substance Use Topics  . Alcohol use: No  . Drug use: No    Home Medications Prior to Admission medications   Medication Sig Start Date End Date Taking? Authorizing Provider  cephALEXin (KEFLEX) 500 MG capsule Take 1 capsule  (500 mg total) by mouth 3 (three) times daily. 07/15/18   Rancour, Jeannett Senior, MD  cyclobenzaprine (FLEXERIL) 10 MG tablet Take 1 tablet (10 mg total) by mouth 3 (three) times daily as needed for muscle spasms. 12/05/17   Zadie Rhine, MD  doxycycline (VIBRAMYCIN) 100 MG capsule Take 1 capsule (100 mg total) by mouth 2 (two) times daily. One po bid x 7 days 03/04/20   Arthor Captain, PA-C  omeprazole (PRILOSEC OTC) 20 MG tablet Take 20 mg by mouth daily.    [provider]  ondansetron (ZOFRAN ODT) 4 MG disintegrating tablet Take 1 tablet (4 mg total) by mouth every 8 (eight) hours as needed for nausea or vomiting. 07/15/18   Rancour, Jeannett Senior, MD  propranolol (INDERAL) 40 MG tablet Take 1 tablet (40 mg total) by mouth 2 (two) times daily. 05/29/17   Leftwich-Kirby, Wilmer Floor, CNM    Allergies    Patient has no known allergies.  Review of Systems   Review of Systems  Constitutional: Negative for chills and fever.  HENT: Negative for sinus pain.   Eyes: Negative  for redness.  Respiratory: Negative for cough and shortness of breath.   Cardiovascular: Negative for chest pain.  Gastrointestinal: Negative for vomiting.  Musculoskeletal: Negative for neck pain and neck stiffness.  Skin: Negative for rash.  Neurological: Positive for headaches.    Physical Exam Updated Vital Signs BP (!) 146/95 (BP Location: Right Arm)   Pulse 67   Temp 98.5 F (36.9 C) (Oral)   Resp 20   Ht 1.676 m (5\' 6" )   Wt 106.6 kg   SpO2 99%   BMI 37.93 kg/m   Physical Exam Vitals and nursing note reviewed.  Constitutional:      Appearance: Normal appearance. She is well-developed.  HENT:     Head: Atraumatic.     Mouth/Throat:     Mouth: Mucous membranes are moist.  Eyes:     General: No scleral icterus.    Conjunctiva/sclera: Conjunctivae normal.  Neck:     Trachea: No tracheal deviation.  Cardiovascular:     Rate and Rhythm: Normal rate and regular rhythm.     Pulses: Normal pulses.     Heart  sounds: Normal heart sounds. No murmur heard.  No friction rub. No gallop.   Pulmonary:     Effort: Pulmonary effort is normal. No respiratory distress.     Breath sounds: Normal breath sounds.  Abdominal:     General: There is no distension.     Tenderness: There is no abdominal tenderness.  Genitourinary:    Comments: No cva tenderness.  Musculoskeletal:        General: No swelling.     Cervical back: Normal range of motion and neck supple. No rigidity. No muscular tenderness.  Skin:    General: Skin is warm and dry.     Findings: No rash.  Neurological:     Mental Status: She is alert.     Comments: Alert, speech normal. Steady gait.   Psychiatric:        Mood and Affect: Mood normal.     ED Results / Procedures / Treatments   Labs (all labs ordered are listed, but only abnormal results are displayed) Labs Reviewed  SARS CORONAVIRUS 2 BY RT PCR (HOSPITAL ORDER, PERFORMED IN [facility])    EKG None  Radiology No results found.  Procedures Procedures (including critical care time)  Medications Ordered in ED Medications  acetaminophen (TYLENOL) tablet 1,000 mg (has no administration in time range)    ED Course  I have reviewed the triage vital signs and the nursing notes.  Pertinent labs & imaging results that were available during my care of the patient were reviewed by me and considered in my medical decision making (see chart for details).    MDM Rules/Calculators/A&P                          Pt presents requesting covid testing.   Sheena Cain was evaluated in Emergency Department on 06/30/2020 for the symptoms described in the history of present illness. She was evaluated in the context of the global COVID-19 pandemic, which necessitated consideration that the patient might be at risk for infection with the SARS-CoV-2 virus that causes COVID-19. Institutional protocols and algorithms that pertain to the evaluation of patients at risk for  COVID-19 are in a state of rapid change based on information released by regulatory bodies including the CDC and federal and state organizations. These policies and algorithms were followed during the patient's care  in the ED.  Reviewed nursing notes and prior charts for additional history.   covid swab remains pending. Discussed w pt, may check mychart or call for results.   covid information provided.   Acetaminophen po. Po fluids.  Pt appears stable for d/c.    Final Clinical Impression(s) / ED Diagnoses Final diagnoses:  None    Rx / DC Orders ED Discharge Orders    None       Cathren Laine, MD 06/30/20 [phone]

## 2020-06-30 NOTE — ED Triage Notes (Signed)
Pt reports 2 of her household members tested positive for covid this morning.  C/o headache and nausea x 2 days.

## 2020-06-30 NOTE — Discharge Instructions (Addendum)
It was our pleasure to provide your ER care today - we hope that you feel better.  Your test result is still pending and should be resulted in the next 1-2 hours - you may check MyChart or call for results.   See Covid information provided.   Your blood pressure is mildly high today - see information provided, limit salt intake, and follow up with primary care doctor for recheck in the next couple weeks.   Return to ER if worse, new symptoms, increased trouble breathing, or other concern.

## 2020-10-27 ENCOUNTER — Encounter (HOSPITAL_COMMUNITY): Payer: Self-pay | Admitting: Emergency Medicine

## 2020-10-27 ENCOUNTER — Other Ambulatory Visit: Payer: Self-pay

## 2020-10-27 ENCOUNTER — Emergency Department (HOSPITAL_COMMUNITY)
Admission: EM | Admit: 2020-10-27 | Discharge: 2020-10-28 | Disposition: A | Discharge status: Home / Self Care | Payer: Managed Care, Other (non HMO) | Attending: Emergency Medicine | Admitting: Emergency Medicine

## 2020-10-27 DIAGNOSIS — U071 COVID-19: Secondary | ICD-10-CM | POA: Diagnosis not present

## 2020-10-27 DIAGNOSIS — I10 Essential (primary) hypertension: Secondary | ICD-10-CM | POA: Diagnosis not present

## 2020-10-27 DIAGNOSIS — E860 Dehydration: Secondary | ICD-10-CM

## 2020-10-27 DIAGNOSIS — R6883 Chills (without fever): Secondary | ICD-10-CM | POA: Diagnosis present

## 2020-10-27 DIAGNOSIS — R310 Gross hematuria: Secondary | ICD-10-CM | POA: Insufficient documentation

## 2020-10-27 DIAGNOSIS — N390 Urinary tract infection, site not specified: Secondary | ICD-10-CM

## 2020-10-27 LAB — URINALYSIS, ROUTINE W REFLEX MICROSCOPIC
Bilirubin Urine: NEGATIVE
Glucose, UA: NEGATIVE mg/dL
Ketones, ur: 5 mg/dL — AB
Nitrite: NEGATIVE
Protein, ur: 30 mg/dL — AB
RBC / HPF: >50 RBC/hpf — ABNORMAL HIGH (ref 0–5)
Specific Gravity, Urine: 1.025 (ref 1.005–1.030)
WBC, UA: >50 WBC/hpf — ABNORMAL HIGH (ref 0–5)
pH: 5 (ref 5.0–8.0)

## 2020-10-27 LAB — RESP PANEL BY RT-PCR (FLU A&B, COVID) ARPGX2
Influenza A by PCR: NEGATIVE
Influenza B by PCR: NEGATIVE
SARS Coronavirus 2 by RT PCR: POSITIVE — AB

## 2020-10-27 NOTE — ED Notes (Signed)
CRITICAL VALUE ALERT  Critical Value:  Covid +  Date & Time Notied:  10/27/20 @ 2056 Provider Notified: Dr Deretha Emory Orders Received/Actions taken: None

## 2020-10-27 NOTE — ED Triage Notes (Signed)
Patient exposed to Covid through family and work. Per patient started last Saturday with nausea. Patient now has headache, fever, vomiting, cough, body aches, and fatigue. Denies any diarrhea. Highest fever 102. Patient taking over the counter medication-Cold and flu with no relief.

## 2020-10-28 ENCOUNTER — Emergency Department (HOSPITAL_COMMUNITY): Payer: Managed Care, Other (non HMO)

## 2020-10-28 LAB — COMPREHENSIVE METABOLIC PANEL
ALT: 28 U/L (ref 0–44)
AST: 31 U/L (ref 15–41)
Albumin: 3.4 g/dL — ABNORMAL LOW (ref 3.5–5.0)
Alkaline Phosphatase: 42 U/L (ref 38–126)
Anion gap: 8 (ref 5–15)
BUN: 15 mg/dL (ref 6–20)
CO2: 25 mmol/L (ref 22–32)
Calcium: 8.5 mg/dL — ABNORMAL LOW (ref 8.9–10.3)
Chloride: 103 mmol/L (ref 98–111)
Creatinine, Ser: 0.82 mg/dL (ref 0.44–1.00)
GFR, Estimated: >60 mL/min (ref >60)
Glucose, Bld: 137 mg/dL — ABNORMAL HIGH (ref 70–99)
Potassium: 3.4 mmol/L — ABNORMAL LOW (ref 3.5–5.1)
Sodium: 136 mmol/L (ref 135–145)
Total Bilirubin: 0.4 mg/dL (ref 0.3–1.2)
Total Protein: 7 g/dL (ref 6.5–8.1)

## 2020-10-28 LAB — LIPASE, BLOOD: Lipase: 22 U/L (ref 11–51)

## 2020-10-28 LAB — CBC
HCT: 38.9 % (ref 36.0–46.0)
Hemoglobin: 12.3 g/dL (ref 12.0–15.0)
MCH: 26.7 pg (ref 26.0–34.0)
MCHC: 31.6 g/dL (ref 30.0–36.0)
MCV: 84.4 fL (ref 80.0–100.0)
Platelets: 191 10*3/uL (ref 150–400)
RBC: 4.61 MIL/uL (ref 3.87–5.11)
RDW: 14 % (ref 11.5–15.5)
WBC: 4.9 10*3/uL (ref 4.0–10.5)
nRBC: 0 % (ref 0.0–0.2)

## 2020-10-28 LAB — TROPONIN I (HIGH SENSITIVITY): Troponin I (High Sensitivity): 4 ng/L (ref <18)

## 2020-10-28 MED ORDER — SODIUM CHLORIDE 0.9 % IV SOLN
1.0000 g | Freq: Once | INTRAVENOUS | Status: AC
  Administered 2020-10-28: 1 g via INTRAVENOUS
  Filled 2020-10-28: qty 10

## 2020-10-28 MED ORDER — PREDNISONE 20 MG PO TABS: ORAL_TABLET | ORAL | 0 refills | Status: DC

## 2020-10-28 MED ORDER — DEXAMETHASONE SODIUM PHOSPHATE 10 MG/ML IJ SOLN
10.0000 mg | Freq: Once | INTRAMUSCULAR | Status: AC
  Administered 2020-10-28: 10 mg via INTRAVENOUS
  Filled 2020-10-28: qty 1

## 2020-10-28 MED ORDER — ONDANSETRON 8 MG PO TBDP: 8.0000 mg | ORAL_TABLET | Freq: Three times a day (TID) | ORAL | 0 refills | Status: DC | PRN

## 2020-10-28 MED ORDER — AZITHROMYCIN 250 MG PO TABS: ORAL_TABLET | ORAL | 0 refills | Status: DC

## 2020-10-28 MED ORDER — SODIUM CHLORIDE 0.9 % IV BOLUS
1000.0000 mL | Freq: Once | INTRAVENOUS | Status: AC
  Administered 2020-10-28: 1000 mL via INTRAVENOUS

## 2020-10-28 MED ORDER — CEPHALEXIN 500 MG PO CAPS: 500.0000 mg | ORAL_CAPSULE | Freq: Three times a day (TID) | ORAL | 0 refills | Status: DC

## 2020-10-28 MED ORDER — ONDANSETRON HCL 4 MG/2ML IJ SOLN
4.0000 mg | Freq: Once | INTRAMUSCULAR | Status: AC
  Administered 2020-10-28: 4 mg via INTRAVENOUS
  Filled 2020-10-28: qty 2

## 2020-10-28 NOTE — Discharge Instructions (Addendum)
Try to drink plenty of fluids so you do not get dehydrated.  Use the Zofran for the nausea or vomiting.  Take the antibiotics, cephalexin and Zithromax as prescribed.  Take the prednisone as prescribed.  Hopefully these will help with your COVID illness.  You should also be taking zinc 50 mg, vitamin D and vitamin C daily and aspirin 81 mg a day.  If you can monitor your oxygen levels you should return to the emergency department if your oxygen gets below 90%.  Recheck your COVID test in 5 days.  You can check in MyChart in about 2 days to get the results of your urine culture.  Hopefully the antibiotics I prescribed will work for your infection.  You should also consider seeing a urologist about the blood in your urine, you also had blood in your urine in 2019.

## 2020-10-28 NOTE — ED Provider Notes (Signed)
[facility] EMERGENCY DEPARTMENT Provider Note   CSN: [id number] Arrival date & time: 10/27/20  1707   Time seen 12:40 AM  History Chief Complaint  Patient presents with  . Vomiting  . Covid Positive    Sheena Cain is a 55 y.o. female.  HPI   Patient states she started feeling bad on December 30.  She has had chills and fever however she has not had a fever the past 24 hours.  She initially had a dry cough but now it is productive of white sputum.  She has had a frontal headache.  She has had nausea and vomiting once or twice a day since she first got ill.  She states the last couple days she has been having gagging.  She states she has not eaten in 3 to 4 days because of loss of appetite.  She denies any diarrhea.  She states she is having upper abdominal pain that sharp and dull and comes and goes and lasts about 15 minutes.  She states if she eats it sharp.  She also states she had some sharp central chest pain that radiated down her right arm 3 days ago.  She had 3 episodes lasting 30 minutes each.  She started getting short of breath 3 days ago.  She states she is having increased urinary output and has been seeing blood in her urine today.  She states she had a hysterectomy at age 52.  She states that coworkers had come to work sick and she thinks that is how she was exposed.  Patient has had 2 of the Moderna COVID-vaccine  PCP Patient, No Pcp Per   Past Medical History:  Diagnosis Date  . Degenerative disc disease, cervical   . Depression   . Hypertension   . Irregular heart beat   . Migraines   . Sleep apnea     Patient Active Problem List   Diagnosis Date Noted  . Adjustment disorder with mixed anxiety and depressed mood 03/28/2015  . Depression, major, recurrent, mild (HCC) 03/28/2015  . High blood pressure 03/28/2015  . Chronic arthritis 03/28/2015    Past Surgical History:  Procedure Laterality Date  . ABDOMINAL HYSTERECTOMY    . VAGINAL HYSTERECTOMY        OB History    Gravida  1   Para  1   Term  1   Preterm      AB      Living  1     SAB      IAB      Ectopic      Multiple      Live Births              Family History  Problem Relation Age of Onset  . Hypertension Mother   . Hypertension Father     Social History   Tobacco Use  . Smoking status: Never Smoker  . Smokeless tobacco: Never Used  Vaping Use  . Vaping Use: Never used  Substance Use Topics  . Alcohol use: No  . Drug use: No  employed  Home Medications Prior to Admission medications   Medication Sig Start Date End Date Taking? Authorizing Provider  azithromycin (ZITHROMAX) 250 MG tablet Take 2 po the first day then once a day for the next 4 days. 10/28/20  Yes Devoria Albe, MD  cephALEXin (KEFLEX) 500 MG capsule Take 1 capsule (500 mg total) by mouth 3 (three) times daily. 10/28/20  Yes Lynelle Doctor,  Jaycelyn Orrison, MD  ondansetron (ZOFRAN ODT) 8 MG disintegrating tablet Take 1 tablet (8 mg total) by mouth every 8 (eight) hours as needed for nausea or vomiting. 10/28/20  Yes Devoria Albe, MD  predniSONE (DELTASONE) 20 MG tablet Take 3 po QD x 3d , then 2 po QD x 3d then 1 po QD x 3d 10/28/20  Yes Devoria Albe, MD  cyclobenzaprine (FLEXERIL) 10 MG tablet Take 1 tablet (10 mg total) by mouth 3 (three) times daily as needed for muscle spasms. 12/05/17   Zadie Rhine, MD  doxycycline (VIBRAMYCIN) 100 MG capsule Take 1 capsule (100 mg total) by mouth 2 (two) times daily. One po bid x 7 days 03/04/20   Arthor Captain, PA-C  omeprazole (PRILOSEC OTC) 20 MG tablet Take 20 mg by mouth daily.    [provider]  propranolol (INDERAL) 40 MG tablet Take 1 tablet (40 mg total) by mouth 2 (two) times daily. 05/29/17   Leftwich-Kirby, Wilmer Floor, CNM    Allergies    Patient has no known allergies.  Review of Systems   Review of Systems  All other systems reviewed and are negative.   Physical Exam Updated Vital Signs BP 133/77   Pulse 94   Temp 99.6 F (37.6 C) (Oral)    Resp 20   Ht 5' 6.5" (1.689 m)   Wt 117.9 kg   SpO2 93%   BMI 41.34 kg/m   Physical Exam Vitals and nursing note reviewed.  Constitutional:      General: She is not in acute distress.    Appearance: Normal appearance. She is obese. She is ill-appearing. She is not toxic-appearing.  HENT:     Head: Normocephalic and atraumatic.     Right Ear: External ear normal.     Left Ear: External ear normal.     Mouth/Throat:     Mouth: Mucous membranes are dry.  Eyes:     Extraocular Movements: Extraocular movements intact.     Conjunctiva/sclera: Conjunctivae normal.     Pupils: Pupils are equal, round, and reactive to light.  Cardiovascular:     Rate and Rhythm: Normal rate and regular rhythm.     Pulses: Normal pulses.     Heart sounds: Normal heart sounds.  Pulmonary:     Effort: Pulmonary effort is normal.     Breath sounds: Decreased air movement present. Examination of the right-lower field reveals rales. Rales present. No wheezing or rhonchi.     Comments: Coughs frequently Musculoskeletal:        General: Normal range of motion.     Cervical back: Normal range of motion.  Skin:    General: Skin is warm and dry.  Neurological:     General: No focal deficit present.     Mental Status: She is alert and oriented to person, place, and time.     Cranial Nerves: No cranial nerve deficit.  Psychiatric:        Mood and Affect: Affect is flat.        Speech: Speech normal.        Behavior: Behavior normal. Behavior is cooperative.     ED Results / Procedures / Treatments   Labs (all labs ordered are listed, but only abnormal results are displayed) Results for orders placed or performed during the hospital encounter of 10/27/20  Resp Panel by RT-PCR (Flu A&B, Covid) Nasopharyngeal Swab   Specimen: Nasopharyngeal Swab; Nasopharyngeal(NP) swabs in vial transport medium  Result Value Ref Range  SARS Coronavirus 2 by RT PCR POSITIVE (A) NEGATIVE   Influenza A by PCR NEGATIVE  NEGATIVE   Influenza B by PCR NEGATIVE NEGATIVE  Lipase, blood  Result Value Ref Range   Lipase 22 11 - 51 U/L  Comprehensive metabolic panel  Result Value Ref Range   Sodium 136 135 - 145 mmol/L   Potassium 3.4 (L) 3.5 - 5.1 mmol/L   Chloride 103 98 - 111 mmol/L   CO2 25 22 - 32 mmol/L   Glucose, Bld 137 (H) 70 - 99 mg/dL   BUN 15 6 - 20 mg/dL   Creatinine, Ser 8.46 0.44 - 1.00 mg/dL   Calcium 8.5 (L) 8.9 - 10.3 mg/dL   Total Protein 7.0 6.5 - 8.1 g/dL   Albumin 3.4 (L) 3.5 - 5.0 g/dL   AST 31 15 - 41 U/L   ALT 28 0 - 44 U/L   Alkaline Phosphatase 42 38 - 126 U/L   Total Bilirubin 0.4 0.3 - 1.2 mg/dL   GFR, Estimated >65 >99 mL/min   Anion gap 8 5 - 15  CBC  Result Value Ref Range   WBC 4.9 4.0 - 10.5 K/uL   RBC 4.61 3.87 - 5.11 MIL/uL   Hemoglobin 12.3 12.0 - 15.0 g/dL   HCT 35.7 01.7 - 79.3 %   MCV 84.4 80.0 - 100.0 fL   MCH 26.7 26.0 - 34.0 pg   MCHC 31.6 30.0 - 36.0 g/dL   RDW 90.3 00.9 - 23.3 %   Platelets 191 150 - 400 K/uL   nRBC 0.0 0.0 - 0.2 %  Urinalysis, Routine w reflex microscopic  Result Value Ref Range   Color, Urine YELLOW YELLOW   APPearance HAZY (A) CLEAR   Specific Gravity, Urine 1.025 1.005 - 1.030   pH 5.0 5.0 - 8.0   Glucose, UA NEGATIVE NEGATIVE mg/dL   Hgb urine dipstick MODERATE (A) NEGATIVE   Bilirubin Urine NEGATIVE NEGATIVE   Ketones, ur 5 (A) NEGATIVE mg/dL   Protein, ur 30 (A) NEGATIVE mg/dL   Nitrite NEGATIVE NEGATIVE   Leukocytes,Ua LARGE (A) NEGATIVE   RBC / HPF >50 (H) 0 - 5 RBC/hpf   WBC, UA >50 (H) 0 - 5 WBC/hpf   Bacteria, UA RARE (A) NONE SEEN   Squamous Epithelial / LPF 0-5 0 - 5   Mucus PRESENT   Troponin I (High Sensitivity)  Result Value Ref Range   Troponin I (High Sensitivity) 4 <18 ng/L   Laboratory interpretation all normal except hematuria and pyuria without positive nitrite (she also had some blood and white blood cells in her urine and 2019).    EKG  EKG Interpretation  Date/Time:  Cain October 28 2020  03:11:18 EST Ventricular Rate:  82 PR Interval:    QRS Duration: 93 QT Interval:  380 QTC Calculation: 444 R Axis:   88 Text Interpretation: Sinus rhythm Borderline T abnormalities, anterior leads Since last tracing rate faster 15 Jul 2018 Confirmed by Devoria Albe ([id number]) on 10/28/2020 3:19:38 AM   Radiology DG Chest Portable 1 View  Result Date: 10/28/2020 CLINICAL DATA:  Initial evaluation for acute COVID infection. EXAM: PORTABLE CHEST 1 VIEW COMPARISON:  Prior radiograph from 12/05/2017. FINDINGS: Mild cardiomegaly, stable. Mediastinal silhouette within normal limits. Lungs are hypoinflated. No pulmonary edema or pleural effusion. Patchy and streaky left basilar opacity could reflect atelectasis or infiltrate. Lungs are otherwise largely clear at this time. No pneumothorax. No acute osseous finding. IMPRESSION: Shallow lung inflation with associated patchy  and streaky left basilar opacity. Finding could reflect atelectasis or possibly infiltrate. Electronically Signed   By: Benjamin  McClintock M.D.   On: 10/28/2020 02Rise Mu:01    Procedures Procedures (including critical care time)  Medications Ordered in ED Medications  sodium chloride 0.9 % bolus 1,000 mL (1,000 mLs Intravenous New Bag/Given 10/28/20 0243)  ondansetron (ZOFRAN) injection 4 mg (4 mg Intravenous Given 10/28/20 0244)  cefTRIAXone (ROCEPHIN) 1 g in sodium chloride 0.9 % 100 mL IVPB (1 g Intravenous New Bag/Given 10/28/20 0244)  dexamethasone (DECADRON) injection 10 mg (10 mg Intravenous Given 10/28/20 0244)    ED Course  I have reviewed the triage vital signs and the nursing notes.  Pertinent labs & imaging results that were available during my care of the patient were reviewed by me and considered in my medical decision making (see chart for details).    MDM Rules/Calculators/A&P                          Although typically we do not like to give fluids to COVID patients patient appears to be dehydrated.  She was given 1 L IV  fluids.  She is at the end of the window to getting monoclonal antibodies.  Laboratory testing was done.  Due to her chest pain a couple days ago she had an EKG and troponins done.  Patient's first troponin is negative, since her pain was a couple days ago this should be adequate.  Recheck at 4:00 AM patient is feeling better after IV fluids.  We discussed what she should be taking over-the-counter and she should test herself again in about 5 days to see if she still positive for COVID.  She had hematuria about 2 years ago and now has gross hematuria although she may have a UTI, she should probably be evaluated by urology or nephrology.  Her BUN and creatinine are normal.  She was given the number for [facility].  Sheena Cain was evaluated in Emergency Department on 10/28/2020 for the symptoms described in the history of present illness. She was evaluated in the context of the global COVID-19 pandemic, which necessitated consideration that the patient might be at risk for infection with the SARS-CoV-2 virus that causes COVID-19. Institutional protocols and algorithms that pertain to the evaluation of patients at risk for COVID-19 are in a state of rapid change based on information released by regulatory bodies including the CDC and federal and state organizations. These policies and algorithms were followed during the patient's care in the ED.   Final Clinical Impression(s) / ED Diagnoses Final diagnoses:  COVID-19 virus infection  Urinary tract infection with hematuria, site unspecified  Dehydration  Gross hematuria    Rx / DC Orders ED Discharge Orders         Ordered    ondansetron (ZOFRAN ODT) 8 MG disintegrating tablet  Every 8 hours PRN        10/28/20 0417    azithromycin (ZITHROMAX) 250 MG tablet        10/28/20 0417    cephALEXin (KEFLEX) 500 MG capsule  3 times daily        10/28/20 0417    predniSONE (DELTASONE) 20 MG tablet        10/28/20 0417         Plan  discharge  Devoria AlbeIva Jakhari Space, MD, [facility]P    Gad Aymond, MD 10/28/20 [phone]

## 2020-10-28 NOTE — ED Notes (Signed)
97% after ambulating on room air.

## 2020-10-29 LAB — URINE CULTURE: Culture: NO GROWTH

## 2020-11-13 ENCOUNTER — Encounter (HOSPITAL_COMMUNITY): Payer: Self-pay

## 2020-11-13 ENCOUNTER — Other Ambulatory Visit: Payer: Self-pay

## 2020-11-13 ENCOUNTER — Emergency Department (HOSPITAL_COMMUNITY)
Admission: EM | Admit: 2020-11-13 | Discharge: 2020-11-13 | Disposition: A | Discharge status: Home / Self Care | Payer: Managed Care, Other (non HMO) | Attending: Emergency Medicine | Admitting: Emergency Medicine

## 2020-11-13 ENCOUNTER — Emergency Department (HOSPITAL_COMMUNITY): Payer: Managed Care, Other (non HMO)

## 2020-11-13 DIAGNOSIS — Z79899 Other long term (current) drug therapy: Secondary | ICD-10-CM | POA: Insufficient documentation

## 2020-11-13 DIAGNOSIS — R079 Chest pain, unspecified: Secondary | ICD-10-CM | POA: Diagnosis present

## 2020-11-13 DIAGNOSIS — I10 Essential (primary) hypertension: Secondary | ICD-10-CM | POA: Diagnosis not present

## 2020-11-13 LAB — BASIC METABOLIC PANEL
Anion gap: 8 (ref 5–15)
BUN: 15 mg/dL (ref 6–20)
CO2: 25 mmol/L (ref 22–32)
Calcium: 9.1 mg/dL (ref 8.9–10.3)
Chloride: 106 mmol/L (ref 98–111)
Creatinine, Ser: 0.68 mg/dL (ref 0.44–1.00)
GFR, Estimated: >60 mL/min (ref >60)
Glucose, Bld: 101 mg/dL — ABNORMAL HIGH (ref 70–99)
Potassium: 3.8 mmol/L (ref 3.5–5.1)
Sodium: 139 mmol/L (ref 135–145)

## 2020-11-13 LAB — CBC
HCT: 38.2 % (ref 36.0–46.0)
Hemoglobin: 11.7 g/dL — ABNORMAL LOW (ref 12.0–15.0)
MCH: 26.2 pg (ref 26.0–34.0)
MCHC: 30.6 g/dL (ref 30.0–36.0)
MCV: 85.7 fL (ref 80.0–100.0)
Platelets: 298 10*3/uL (ref 150–400)
RBC: 4.46 MIL/uL (ref 3.87–5.11)
RDW: 14.6 % (ref 11.5–15.5)
WBC: 7.8 10*3/uL (ref 4.0–10.5)
nRBC: 0 % (ref 0.0–0.2)

## 2020-11-13 LAB — TROPONIN I (HIGH SENSITIVITY)
Troponin I (High Sensitivity): 2 ng/L (ref <18)
Troponin I (High Sensitivity): 2 ng/L (ref <18)

## 2020-11-13 MED ORDER — HYDROCODONE-ACETAMINOPHEN 5-325 MG PO TABS
1.0000 | ORAL_TABLET | Freq: Once | ORAL | Status: AC
  Administered 2020-11-13: 1 via ORAL
  Filled 2020-11-13: qty 1

## 2020-11-13 NOTE — ED Triage Notes (Addendum)
Pt reports that she has been having dizziness, SOB, chest pain and nausea. Pt was seen 3 weeks ago for covid and pressure was high at that time. BP was checked this morning close to 9am  165/127 and 155/98. Pt BP 118/82 in triage. Pt reports the feeling comes and goes described as feeling drunk

## 2020-11-13 NOTE — ED Provider Notes (Signed)
[facility] EMERGENCY DEPARTMENT Provider Note   CSN: [id number] Arrival date & time: 11/13/20  1200     History Chief Complaint  Patient presents with  . Hypertension    Sheena Cain is a 55 y.o. female.  Patient complains having some chest pain and dizziness.  Patient has had mildly elevated blood pressure  The history is provided by the patient and medical records. No language interpreter was used.  Dizziness Quality:  Lightheadedness Severity:  Mild Onset quality:  Sudden Timing:  Intermittent Progression:  Waxing and waning Chronicity:  New Context: not when bending over   Relieved by:  Nothing Worsened by:  Nothing Ineffective treatments:  None tried Associated symptoms: no chest pain, no diarrhea and no headaches        Past Medical History:  Diagnosis Date  . Degenerative disc disease, cervical   . Depression   . Hypertension   . Irregular heart beat   . Migraines   . Sleep apnea     Patient Active Problem List   Diagnosis Date Noted  . Adjustment disorder with mixed anxiety and depressed mood 03/28/2015  . Depression, major, recurrent, mild (HCC) 03/28/2015  . High blood pressure 03/28/2015  . Chronic arthritis 03/28/2015    Past Surgical History:  Procedure Laterality Date  . ABDOMINAL HYSTERECTOMY    . VAGINAL HYSTERECTOMY       OB History    Gravida  1   Para  1   Term  1   Preterm      AB      Living  1     SAB      IAB      Ectopic      Multiple      Live Births              Family History  Problem Relation Age of Onset  . Hypertension Mother   . Hypertension Father     Social History   Tobacco Use  . Smoking status: Never Smoker  . Smokeless tobacco: Never Used  Vaping Use  . Vaping Use: Never used  Substance Use Topics  . Alcohol use: No  . Drug use: No    Home Medications Prior to Admission medications   Medication Sig Start Date End Date Taking? Authorizing Provider  azithromycin  (ZITHROMAX) 250 MG tablet Take 2 po the first day then once a day for the next 4 days. 10/28/20   Devoria Albe, MD  cephALEXin (KEFLEX) 500 MG capsule Take 1 capsule (500 mg total) by mouth 3 (three) times daily. 10/28/20   Devoria Albe, MD  cyclobenzaprine (FLEXERIL) 10 MG tablet Take 1 tablet (10 mg total) by mouth 3 (three) times daily as needed for muscle spasms. 12/05/17   Zadie Rhine, MD  doxycycline (VIBRAMYCIN) 100 MG capsule Take 1 capsule (100 mg total) by mouth 2 (two) times daily. One po bid x 7 days 03/04/20   Arthor Captain, PA-C  omeprazole (PRILOSEC OTC) 20 MG tablet Take 20 mg by mouth daily.    [provider]  ondansetron (ZOFRAN ODT) 8 MG disintegrating tablet Take 1 tablet (8 mg total) by mouth every 8 (eight) hours as needed for nausea or vomiting. 10/28/20   Devoria Albe, MD  predniSONE (DELTASONE) 20 MG tablet Take 3 po QD x 3d , then 2 po QD x 3d then 1 po QD x 3d 10/28/20   Devoria Albe, MD  propranolol (INDERAL) 40 MG tablet Take 1  tablet (40 mg total) by mouth 2 (two) times daily. 05/29/17   Leftwich-Kirby, Wilmer Floor, CNM    Allergies    Patient has no known allergies.  Review of Systems   Review of Systems  Constitutional: Negative for appetite change and fatigue.  HENT: Negative for congestion, ear discharge and sinus pressure.   Eyes: Negative for discharge.  Respiratory: Negative for cough.   Cardiovascular: Negative for chest pain.  Gastrointestinal: Negative for abdominal pain and diarrhea.  Genitourinary: Negative for frequency and hematuria.  Musculoskeletal: Negative for back pain.  Skin: Negative for rash.  Neurological: Positive for dizziness. Negative for seizures and headaches.  Psychiatric/Behavioral: Negative for hallucinations.    Physical Exam Updated Vital Signs BP 137/78 (BP Location: Right Arm)   Pulse 79   Temp 98 F (36.7 C) (Oral)   Resp 18   Ht 5\' 6"  (1.676 m)   Wt 104.3 kg   SpO2 100%   BMI 37.12 kg/m   Physical Exam Vitals and  nursing note reviewed.  Constitutional:      Appearance: She is well-developed.  HENT:     Head: Normocephalic.     Nose: Nose normal.  Eyes:     General: No scleral icterus.    Extraocular Movements: EOM normal.     Conjunctiva/sclera: Conjunctivae normal.  Neck:     Thyroid: No thyromegaly.  Cardiovascular:     Rate and Rhythm: Normal rate and regular rhythm.     Heart sounds: No murmur heard. No friction rub. No gallop.   Pulmonary:     Breath sounds: No stridor. No wheezing or rales.  Chest:     Chest wall: No tenderness.  Abdominal:     General: There is no distension.     Tenderness: There is no abdominal tenderness. There is no rebound.  Musculoskeletal:        General: No edema. Normal range of motion.     Cervical back: Neck supple.  Lymphadenopathy:     Cervical: No cervical adenopathy.  Skin:    Findings: No erythema or rash.  Neurological:     Mental Status: She is alert and oriented to person, place, and time.     Motor: No abnormal muscle tone.     Coordination: Coordination normal.  Psychiatric:        Mood and Affect: Mood and affect normal.        Behavior: Behavior normal.     ED Results / Procedures / Treatments   Labs (all labs ordered are listed, but only abnormal results are displayed) Labs Reviewed  BASIC METABOLIC PANEL - Abnormal; Notable for the following components:      Result Value   Glucose, Bld 101 (*)    All other components within normal limits  CBC - Abnormal; Notable for the following components:   Hemoglobin 11.7 (*)    All other components within normal limits  TROPONIN I (HIGH SENSITIVITY)  TROPONIN I (HIGH SENSITIVITY)    EKG None  Radiology DG Chest 2 View  Result Date: 11/13/2020 CLINICAL DATA:  Dizziness, shortness of breath and chest pain. Coronavirus infection 3 weeks ago. EXAM: CHEST - 2 VIEW COMPARISON:  10/28/2018 FINDINGS: The heart size and mediastinal contours are within normal limits. Both lungs are clear.  The visualized skeletal structures are unremarkable. IMPRESSION: No active cardiopulmonary disease. Electronically Signed   By: 12/27/2018 M.D.   On: 11/13/2020 13:50    Procedures Procedures   Medications Ordered in ED Medications  HYDROcodone-acetaminophen (NORCO/VICODIN) 5-325 MG per tablet 1 tablet (1 tablet Oral Given 11/13/20 2001)    ED Course  I have reviewed the triage vital signs and the nursing notes.  Pertinent labs & imaging results that were available during my care of the patient were reviewed by me and considered in my medical decision making (see chart for details). Patient's labs unremarkable which include 2 troponins CBC chemistries chest x-ray and EKG   MDM Rules/Calculators/A&P                         Patient with dizziness and hypertension.  Symptoms have resolved and labs are unremarkable.  She will follow up with her PCP for blood pressure and was referred to cardiology for evaluation of EKG changes  Final Clinical Impression(s) / ED Diagnoses Final diagnoses:  Primary hypertension    Rx / DC Orders ED Discharge Orders    None       Bethann Berkshire, MD 11/15/20 1011

## 2020-11-13 NOTE — Discharge Instructions (Addendum)
Follow-up with Dr. Wyline Mood or one of his associates for your changes on your EKG and chest discomfort.  Follow-up with your family doctor about your blood pressure

## 2020-12-12 ENCOUNTER — Other Ambulatory Visit: Payer: Self-pay | Admitting: Family Medicine

## 2020-12-12 DIAGNOSIS — Z1231 Encounter for screening mammogram for malignant neoplasm of breast: Secondary | ICD-10-CM

## 2021-02-16 ENCOUNTER — Other Ambulatory Visit: Payer: Self-pay

## 2021-02-16 ENCOUNTER — Emergency Department
Admission: EM | Admit: 2021-02-16 | Discharge: 2021-02-17 | Disposition: A | Discharge status: Home / Self Care | Payer: Managed Care, Other (non HMO) | Attending: Emergency Medicine | Admitting: Emergency Medicine

## 2021-02-16 ENCOUNTER — Emergency Department: Payer: Managed Care, Other (non HMO)

## 2021-02-16 ENCOUNTER — Encounter: Payer: Self-pay | Admitting: Emergency Medicine

## 2021-02-16 DIAGNOSIS — R519 Headache, unspecified: Secondary | ICD-10-CM | POA: Insufficient documentation

## 2021-02-16 DIAGNOSIS — I10 Essential (primary) hypertension: Secondary | ICD-10-CM | POA: Insufficient documentation

## 2021-02-16 DIAGNOSIS — R079 Chest pain, unspecified: Secondary | ICD-10-CM

## 2021-02-16 DIAGNOSIS — R531 Weakness: Secondary | ICD-10-CM | POA: Insufficient documentation

## 2021-02-16 DIAGNOSIS — R202 Paresthesia of skin: Secondary | ICD-10-CM | POA: Diagnosis not present

## 2021-02-16 LAB — TROPONIN I (HIGH SENSITIVITY): Troponin I (High Sensitivity): 5 ng/L (ref <18)

## 2021-02-16 LAB — BASIC METABOLIC PANEL
Anion gap: 9 (ref 5–15)
BUN: 12 mg/dL (ref 6–20)
CO2: 25 mmol/L (ref 22–32)
Calcium: 9.4 mg/dL (ref 8.9–10.3)
Chloride: 105 mmol/L (ref 98–111)
Creatinine, Ser: 0.67 mg/dL (ref 0.44–1.00)
GFR, Estimated: >60 mL/min (ref >60)
Glucose, Bld: 138 mg/dL — ABNORMAL HIGH (ref 70–99)
Potassium: 3.7 mmol/L (ref 3.5–5.1)
Sodium: 139 mmol/L (ref 135–145)

## 2021-02-16 LAB — CBC
HCT: 38.5 % (ref 36.0–46.0)
Hemoglobin: 12.4 g/dL (ref 12.0–15.0)
MCH: 27.1 pg (ref 26.0–34.0)
MCHC: 32.2 g/dL (ref 30.0–36.0)
MCV: 84.2 fL (ref 80.0–100.0)
Platelets: 261 10*3/uL (ref 150–400)
RBC: 4.57 MIL/uL (ref 3.87–5.11)
RDW: 14.5 % (ref 11.5–15.5)
WBC: 6.5 10*3/uL (ref 4.0–10.5)
nRBC: 0 % (ref 0.0–0.2)

## 2021-02-16 LAB — URINALYSIS, COMPLETE (UACMP) WITH MICROSCOPIC
Bilirubin Urine: NEGATIVE
Glucose, UA: NEGATIVE mg/dL
Hgb urine dipstick: NEGATIVE
Ketones, ur: NEGATIVE mg/dL
Nitrite: NEGATIVE
Protein, ur: NEGATIVE mg/dL
Specific Gravity, Urine: 1.02 (ref 1.005–1.030)
pH: 5 (ref 5.0–8.0)

## 2021-02-16 LAB — CBG MONITORING, ED: Glucose-Capillary: 106 mg/dL — ABNORMAL HIGH (ref 70–99)

## 2021-02-16 MED ORDER — KETOROLAC TROMETHAMINE 30 MG/ML IJ SOLN
30.0000 mg | Freq: Once | INTRAMUSCULAR | Status: AC
  Administered 2021-02-16: 30 mg via INTRAVENOUS
  Filled 2021-02-16: qty 1

## 2021-02-16 MED ORDER — SODIUM CHLORIDE 0.9 % IV BOLUS
1000.0000 mL | Freq: Once | INTRAVENOUS | Status: AC
  Administered 2021-02-16: 1000 mL via INTRAVENOUS

## 2021-02-16 MED ORDER — METOCLOPRAMIDE HCL 5 MG/ML IJ SOLN
10.0000 mg | Freq: Once | INTRAMUSCULAR | Status: AC
  Administered 2021-02-16: 10 mg via INTRAVENOUS
  Filled 2021-02-16: qty 2

## 2021-02-16 MED ORDER — DIPHENHYDRAMINE HCL 50 MG/ML IJ SOLN
50.0000 mg | Freq: Once | INTRAMUSCULAR | Status: AC
  Administered 2021-02-16: 50 mg via INTRAVENOUS
  Filled 2021-02-16: qty 1

## 2021-02-16 NOTE — ED Triage Notes (Signed)
Pt reports she woke up this morning with a headache, and dizziness reports this evening she developed numbness to left side of the face and pain to left arm that has resolved at present. Pt reports felt balance was off this morning. Pt talks in complete sentences no respiratory distress noted.

## 2021-02-16 NOTE — ED Notes (Signed)
Patient transported to MRI 

## 2021-02-16 NOTE — ED Provider Notes (Signed)
[facility] Emergency Department Provider Note  Time seen: 9:45 PM  I have reviewed the triage vital signs and the nursing notes.   HISTORY  Chief Complaint Dizziness and Numbness   HPI Sheena Cain is a 55 y.o. female with a past medical history of depression, hypertension, migraines, presents to the emergency department with complaints of a headache, left-sided paresthesias, chest pains and weakness.  According to the patient since earlier this morning she has been experiencing headache however she states she has a headache nearly every day.  This headache she states is moderate in severity 5/10, dull aching type pain however around 5 or 6 PM tonight she began experiencing some paresthesias or tingling sensation to the left face.  States she felt like she could be having trouble walking and was experiencing intermittent pain in her chest and possibly in her left arm per patient.  Patient states her symptoms have largely resolved besides a headache and tingling sensation to the left face which she describes a tingling or itching.  No history of stroke previously.  No fever cough congestion or shortness of breath.  No dysuria.   Past Medical History:  Diagnosis Date  . Degenerative disc disease, cervical   . Depression   . Hypertension   . Irregular heart beat   . Migraines   . Sleep apnea     Patient Active Problem List   Diagnosis Date Noted  . Adjustment disorder with mixed anxiety and depressed mood 03/28/2015  . Depression, major, recurrent, mild (HCC) 03/28/2015  . High blood pressure 03/28/2015  . Chronic arthritis 03/28/2015    Past Surgical History:  Procedure Laterality Date  . ABDOMINAL HYSTERECTOMY    . VAGINAL HYSTERECTOMY      Prior to Admission medications   Medication Sig Start Date End Date Taking? Authorizing Provider  azithromycin (ZITHROMAX) 250 MG tablet Take 2 po the first day then once a day for the next 4 days. 10/28/20   Devoria Albe, MD  cephALEXin (KEFLEX) 500 MG capsule Take 1 capsule (500 mg total) by mouth 3 (three) times daily. 10/28/20   Devoria Albe, MD  cyclobenzaprine (FLEXERIL) 10 MG tablet Take 1 tablet (10 mg total) by mouth 3 (three) times daily as needed for muscle spasms. 12/05/17   Zadie Rhine, MD  doxycycline (VIBRAMYCIN) 100 MG capsule Take 1 capsule (100 mg total) by mouth 2 (two) times daily. One po bid x 7 days 03/04/20   Arthor Captain, PA-C  omeprazole (PRILOSEC OTC) 20 MG tablet Take 20 mg by mouth daily.    [provider]  ondansetron (ZOFRAN ODT) 8 MG disintegrating tablet Take 1 tablet (8 mg total) by mouth every 8 (eight) hours as needed for nausea or vomiting. 10/28/20   Devoria Albe, MD  predniSONE (DELTASONE) 20 MG tablet Take 3 po QD x 3d , then 2 po QD x 3d then 1 po QD x 3d 10/28/20   Devoria Albe, MD  propranolol (INDERAL) 40 MG tablet Take 1 tablet (40 mg total) by mouth 2 (two) times daily. 05/29/17   Leftwich-Kirby, Wilmer Floor, CNM    No Known Allergies  Family History  Problem Relation Age of Onset  . Hypertension Mother   . Hypertension Father     Social History Social History   Tobacco Use  . Smoking status: Never Smoker  . Smokeless tobacco: Never Used  Vaping Use  . Vaping Use: Never used  Substance Use Topics  . Alcohol use:  No  . Drug use: No    Review of Systems Constitutional: Negative for fever. Cardiovascular: Negative for chest pain. Respiratory: Negative for shortness of breath. Gastrointestinal: Negative for abdominal pain, vomiting Musculoskeletal: Negative for musculoskeletal complaints Neurological: Negative for headache All other ROS negative  ____________________________________________   PHYSICAL EXAM:  VITAL SIGNS: ED Triage Vitals  Enc Vitals Group     BP 02/16/21 1910 (!) 157/91     Pulse Rate 02/16/21 1910 (!) 56     Resp 02/16/21 1910 20     Temp 02/16/21 1910 98.1 F (36.7 C)     Temp Source 02/16/21 1910 Oral     SpO2 02/16/21  1910 100 %     Weight 02/16/21 1911 236 lb (107 kg)     Height 02/16/21 1911 5\' 6"  (1.676 m)     Head Circumference --      Peak Flow --      Pain Score 02/16/21 1911 5     Pain Loc --      Pain Edu? --      Excl. in GC? --    Constitutional: Alert and oriented. Well appearing and in no distress. Eyes: Normal exam ENT      Head: Normocephalic and atraumatic.      Mouth/Throat: Mucous membranes are moist. Cardiovascular: Normal rate, regular rhythm. No murmur Respiratory: Normal respiratory effort without tachypnea nor retractions. Breath sounds are clear Gastrointestinal: Soft and nontender. No distention.  Musculoskeletal: Nontender with normal range of motion in all extremities. Neurologic:  Normal speech and language. No gross focal neurologic deficits.  Equal grip strength bilaterally.  No pronator drift.  No lower extremity drift.  5/5 motor in all extremities.  No cranial nerve deficits on my exam. Skin:  Skin is warm, dry and intact.  Psychiatric: Mood and affect are normal.   ____________________________________________    EKG  EKG viewed and interpreted by myself shows a sinus rhythm at 62 bpm with a narrow QRS, normal axis, normal intervals, no concerning ST changes.  ____________________________________________    RADIOLOGY  MRI is pending  ____________________________________________   INITIAL IMPRESSION / ASSESSMENT AND PLAN / ED COURSE  Pertinent labs & imaging results that were available during my care of the patient were reviewed by me and considered in my medical decision making (see chart for details).   Presents emergency department for multiple complaints including headache, paresthesias/tingling of her left face.  States she was feeling a sensation of generalized weakness earlier today and had chest pain earlier today as well which is since resolved.  Patient continues to have a headache and tingling sensation to her left face.  No history of CVA.   Does have a history of migraines and states nearly daily headaches per patient.  Differential this time is quite broad but would include complicated migraine, CVA, TIA, metabolic or electrolyte abnormality, infectious etiology.  Troponin is negative.  Urinalysis is equivocal given no UTI symptoms we will hold off on treatment and send urine culture.  MRI is pending.  Patient receiving medications and fluids currently.  Patient care signed out to oncoming provider.  Sheena Cain was evaluated in Emergency Department on 02/16/2021 for the symptoms described in the history of present illness. She was evaluated in the context of the global COVID-19 pandemic, which necessitated consideration that the patient might be at risk for infection with the SARS-CoV-2 virus that causes COVID-19. Institutional protocols and algorithms that pertain to the evaluation of patients at risk  for COVID-19 are in a state of rapid change based on information released by regulatory bodies including the CDC and federal and state organizations. These policies and algorithms were followed during the patient's care in the ED.  ____________________________________________   FINAL CLINICAL IMPRESSION(S) / ED DIAGNOSES  Headache Paresthesias   Minna Antis, MD 02/16/21 2236

## 2021-02-16 NOTE — Discharge Instructions (Addendum)
You may take Compazine as needed for headache/nausea.  Urine culture is pending.  You will be notified of any positive results requiring antibiotics.  Return to the ER for worsening symptoms, persistent vomiting, lethargy or other concerns.

## 2021-02-17 MED ORDER — PROCHLORPERAZINE MALEATE 10 MG PO TABS: 10.0000 mg | ORAL_TABLET | Freq: Four times a day (QID) | ORAL | 0 refills | Status: DC | PRN

## 2021-02-17 NOTE — ED Provider Notes (Signed)
-----------------------------------------   12:38 AM on 02/17/2021 -----------------------------------------  Headache improved.  Updated patient on negative MRI result.  Neck is supple and patient does not have any focal neurological deficits.  She is not having any urinary symptoms; sending urine for culture.  Strict return precautions given.  Patient verbalizes understanding agrees with plan of care.  MRI interpreted per Dr. Phill Myron:   Normal brain MRI. No acute intracranial abnormality.      Irean Hong, MD 02/17/21 [phone]

## 2021-02-18 LAB — URINE CULTURE

## 2022-01-27 ENCOUNTER — Encounter (HOSPITAL_COMMUNITY): Payer: Self-pay | Admitting: *Deleted

## 2022-01-27 ENCOUNTER — Emergency Department (HOSPITAL_COMMUNITY): Payer: 59

## 2022-01-27 ENCOUNTER — Emergency Department (HOSPITAL_COMMUNITY)
Admission: EM | Admit: 2022-01-27 | Discharge: 2022-01-27 | Disposition: A | Discharge status: Home / Self Care | Payer: 59 | Attending: Emergency Medicine | Admitting: Emergency Medicine

## 2022-01-27 DIAGNOSIS — R519 Headache, unspecified: Secondary | ICD-10-CM | POA: Diagnosis present

## 2022-01-27 DIAGNOSIS — I10 Essential (primary) hypertension: Secondary | ICD-10-CM | POA: Diagnosis not present

## 2022-01-27 LAB — CBC WITH DIFFERENTIAL/PLATELET
Abs Immature Granulocytes: 0.02 10*3/uL (ref 0.00–0.07)
Basophils Absolute: 0 10*3/uL (ref 0.0–0.1)
Basophils Relative: 1 %
Eosinophils Absolute: 0.1 10*3/uL (ref 0.0–0.5)
Eosinophils Relative: 1 %
HCT: 38.7 % (ref 36.0–46.0)
Hemoglobin: 12.4 g/dL (ref 12.0–15.0)
Immature Granulocytes: 0 %
Lymphocytes Relative: 42 %
Lymphs Abs: 3.3 10*3/uL (ref 0.7–4.0)
MCH: 26.8 pg (ref 26.0–34.0)
MCHC: 32 g/dL (ref 30.0–36.0)
MCV: 83.8 fL (ref 80.0–100.0)
Monocytes Absolute: 0.7 10*3/uL (ref 0.1–1.0)
Monocytes Relative: 9 %
Neutro Abs: 3.7 10*3/uL (ref 1.7–7.7)
Neutrophils Relative %: 47 %
Platelets: 295 10*3/uL (ref 150–400)
RBC: 4.62 MIL/uL (ref 3.87–5.11)
RDW: 14.5 % (ref 11.5–15.5)
WBC: 7.8 10*3/uL (ref 4.0–10.5)
nRBC: 0 % (ref 0.0–0.2)

## 2022-01-27 LAB — BASIC METABOLIC PANEL
Anion gap: 6 (ref 5–15)
BUN: 16 mg/dL (ref 6–20)
CO2: 26 mmol/L (ref 22–32)
Calcium: 9 mg/dL (ref 8.9–10.3)
Chloride: 108 mmol/L (ref 98–111)
Creatinine, Ser: 0.73 mg/dL (ref 0.44–1.00)
GFR, Estimated: >60 mL/min (ref >60)
Glucose, Bld: 101 mg/dL — ABNORMAL HIGH (ref 70–99)
Potassium: 3.9 mmol/L (ref 3.5–5.1)
Sodium: 140 mmol/L (ref 135–145)

## 2022-01-27 LAB — TROPONIN I (HIGH SENSITIVITY): Troponin I (High Sensitivity): 3 ng/L (ref <18)

## 2022-01-27 LAB — URINALYSIS, ROUTINE W REFLEX MICROSCOPIC
Bacteria, UA: NONE SEEN
Bilirubin Urine: NEGATIVE
Glucose, UA: NEGATIVE mg/dL
Ketones, ur: NEGATIVE mg/dL
Nitrite: NEGATIVE
Protein, ur: NEGATIVE mg/dL
Specific Gravity, Urine: 1.021 (ref 1.005–1.030)
pH: 6 (ref 5.0–8.0)

## 2022-01-27 MED ORDER — AMLODIPINE BESYLATE 5 MG PO TABS: 5.0000 mg | ORAL_TABLET | Freq: Every day | ORAL | 0 refills | Status: AC

## 2022-01-27 MED ORDER — ASPIRIN 81 MG PO CHEW
324.0000 mg | CHEWABLE_TABLET | Freq: Once | ORAL | Status: AC
  Administered 2022-01-27: 324 mg via ORAL
  Filled 2022-01-27: qty 4

## 2022-01-27 NOTE — ED Triage Notes (Signed)
States she felt dizzy at work and her blood pressure was up, 172/109. States she is not on medication ?

## 2022-01-27 NOTE — Discharge Instructions (Addendum)
You have been prescribed blood pressure medication for you to start taking daily I do recommend that you continue making sure you are maintaining a low-sodium diet as we discussed.  I do recommend checking your blood pressure randomly every couple of days and write down the numbers obtained for you to share with your primary doctor at your next visit.  This will help them decide whether you are on an appropriate blood pressure medication or if you need any adjustments.  Your lab test today, x-rays and EKG are normal. ?

## 2022-01-27 NOTE — ED Provider Notes (Signed)
?[facility] EMERGENCY DEPARTMENT ?Provider Note ? ? ?CSN: [id number] ?Arrival date & time: 01/27/22  1718 ? ?  ? ?History ? ?Chief Complaint  ?Patient presents with  ? Hypertension  ? ? ?Sheena Cain is a 56 y.o. female with history including migraine headaches, depression and high blood pressure per chart but patient denies presenting for evaluation of multiple complaints including generalized weakness, fatigue, gradual onset of dizziness along with frontal headache and nausea which started around 7 AM while at work this morning.  Her coworkers stated she did not look well, stating she looked flushed in the face and her blood pressure was checked and was 172/109.  She continues to have a mild frontal headache, denies vision changes, photophobia or symptoms suggesting her migraine.  In addition she has had a mild midsternal chest pressure which has been present all day.  She denies palpitations, shortness of breath, peripheral edema, nausea or vomiting, focal weakness.  She has had no treatment for her symptoms prior to arrival. ? ?The history is provided by the patient.  ? ?  ? ?Home Medications ?Prior to Admission medications   ?Medication Sig Start Date End Date Taking? Authorizing Provider  ?amLODipine (NORVASC) 5 MG tablet Take 1 tablet (5 mg total) by mouth daily. 01/27/22  Yes Jemell Town, Raynelle FanningJulie, PA-C  ?omeprazole (PRILOSEC OTC) 20 MG tablet Take 20 mg by mouth daily.    [provider]  ?   ? ?Allergies    ?Patient has no known allergies.   ? ?Review of Systems   ?Review of Systems  ?Constitutional:  Negative for chills and fever.  ?HENT:  Negative for congestion.   ?Eyes: Negative.  Negative for visual disturbance.  ?Respiratory:  Negative for chest tightness and shortness of breath.   ?Cardiovascular:  Positive for chest pain.  ?Gastrointestinal:  Positive for nausea. Negative for abdominal pain and vomiting.  ?Genitourinary: Negative.   ?Musculoskeletal:  Negative for arthralgias, joint swelling and  neck pain.  ?Skin: Negative.  Negative for rash and wound.  ?Neurological:  Positive for headaches. Negative for dizziness, weakness, light-headedness and numbness.  ?Psychiatric/Behavioral: Negative.    ? ?Physical Exam ?Updated Vital Signs ?BP (!) 157/94 (BP Location: Right Arm)   Pulse 64   Temp 98.1 ?F (36.7 ?C) (Oral)   Resp 16   Ht 5\' 6"  (1.676 m)   Wt 108 kg   SpO2 100%   BMI 38.41 kg/m?  ?Physical Exam ?Vitals and nursing note reviewed.  ?Constitutional:   ?   Appearance: She is well-developed.  ?HENT:  ?   Head: Normocephalic and atraumatic.  ?Eyes:  ?   Conjunctiva/sclera: Conjunctivae normal.  ?Cardiovascular:  ?   Rate and Rhythm: Normal rate and regular rhythm.  ?   Heart sounds: Normal heart sounds.  ?Pulmonary:  ?   Effort: Pulmonary effort is normal.  ?   Breath sounds: Normal breath sounds. No wheezing.  ?Abdominal:  ?   General: Bowel sounds are normal.  ?   Palpations: Abdomen is soft.  ?   Tenderness: There is no abdominal tenderness.  ?Musculoskeletal:     ?   General: Normal range of motion.  ?   Cervical back: Normal range of motion.  ?Skin: ?   General: Skin is warm and dry.  ?Neurological:  ?   Mental Status: She is alert.  ? ? ?ED Results / Procedures / Treatments   ?Labs ?(all labs ordered are listed, but only abnormal results are displayed) ?Labs Reviewed  ?  BASIC METABOLIC PANEL - Abnormal; Notable for the following components:  ?    Result Value  ? Glucose, Bld 101 (*)   ? All other components within normal limits  ?URINALYSIS, ROUTINE W REFLEX MICROSCOPIC - Abnormal; Notable for the following components:  ? Hgb urine dipstick SMALL (*)   ? Leukocytes,Ua TRACE (*)   ? All other components within normal limits  ?CBC WITH DIFFERENTIAL/PLATELET  ?TROPONIN I (HIGH SENSITIVITY)  ? ? ?EKG ?EKG Interpretation ? ?Date/Time:  Monday January 27 2022 18:07:19 EDT ?Ventricular Rate:  77 ?PR Interval:  168 ?QRS Duration: 88 ?QT Interval:  374 ?QTC Calculation: 423 ?R Axis:   74 ?Text  Interpretation: Normal sinus rhythm with sinus arrhythmia Normal ECG No significant change since last tracing Confirmed by Alvira Monday ([id number]) on 01/28/2022 11:22:41 PM ? ?Radiology ?DG Chest 1 View ? ?Result Date: 01/27/2022 ?CLINICAL DATA:  Dizziness. EXAM: CHEST  1 VIEW COMPARISON:  Chest x-ray 262 1,019. FINDINGS: The heart size and mediastinal contours are within normal limits. Both lungs are clear. The visualized skeletal structures are unremarkable. IMPRESSION: No active disease. Electronically Signed   By: Darliss Cheney M.D.   On: 01/27/2022 19:25  ? ?CT Head Wo Contrast ? ?Result Date: 01/27/2022 ?CLINICAL DATA:  Headaches and dizziness for 2 days, initial encounter EXAM: CT HEAD WITHOUT CONTRAST TECHNIQUE: Contiguous axial images were obtained from the base of the skull through the vertex without intravenous contrast. RADIATION DOSE REDUCTION: This exam was performed according to the departmental dose-optimization program which includes automated exposure control, adjustment of the mA and/or kV according to patient size and/or use of iterative reconstruction technique. COMPARISON:  07/15/2018 FINDINGS: Brain: No evidence of acute infarction, hemorrhage, hydrocephalus, extra-axial collection or mass lesion/mass effect. Vascular: No hyperdense vessel or unexpected calcification. Skull: Normal. Negative for fracture or focal lesion. Sinuses/Orbits: No acute finding. Other: None. IMPRESSION: No acute intracranial abnormality noted. Electronically Signed   By: Alcide Clever M.D.   On: 01/27/2022 19:21   ? ?Procedures ?Procedures  ? ? ?Medications Ordered in ED ?Medications  ?aspirin chewable tablet 324 mg (324 mg Oral Given 01/27/22 1907)  ? ? ?ED Course/ Medical Decision Making/ A&P ?  ?                        ?Medical Decision Making ?Pt with headache, chest pressure, elevated bp while at work this am.  She remains moderately hypertensive here with last bp 157/94.  DDX including hypertensive urgency, with  headache, also could include hemorrhagic cva, exam is reassuring and nonfocal.  Chest pressure since early this am,  trop x 1 negative, no indication for delta trops given timing, ekg and cxr reassuring with no evidence of chf or acs.  We discussed control of htn with diet/salt restriction as first steps.  She already is very careful with salt intake.  Will add norvasc, advised checking pressures every couple of days to share with pcp within the next week or two for further management of bp.  Pt was stable at time of dc.   ? ?Amount and/or Complexity of Data Reviewed ?Labs: ordered. ?   Details: bmet, cbc, trop normal ?Radiology: ordered and independent interpretation performed. ?   Details: CT head negative. cxr normal, no interstitial edema, no cardiomegaly ?ECG/medicine tests: ordered and independent interpretation performed. ?   Details: nsr ? ?Risk ?Prescription drug management. ?Decision regarding hospitalization. ? ? ? ? ? ? ? ? ? ? ?Final Clinical  Impression(s) / ED Diagnoses ?Final diagnoses:  ?Hypertension, unspecified type  ? ? ?Rx / DC Orders ?ED Discharge Orders   ? ?      Ordered  ?  amLODipine (NORVASC) 5 MG tablet  Daily       ? 01/27/22 2058  ? ?  ?  ? ?  ? ? ?  ?Burgess Amor, PA-C ?01/29/22 1040 ? ?  ?Bethann Berkshire, MD ?01/31/22 1017 ? ?

## 2022-01-29 ENCOUNTER — Other Ambulatory Visit: Payer: Self-pay

## 2022-01-29 ENCOUNTER — Emergency Department (HOSPITAL_COMMUNITY): Payer: 59

## 2022-01-29 ENCOUNTER — Encounter (HOSPITAL_COMMUNITY): Payer: Self-pay | Admitting: *Deleted

## 2022-01-29 ENCOUNTER — Emergency Department (HOSPITAL_COMMUNITY)
Admission: EM | Admit: 2022-01-29 | Discharge: 2022-01-29 | Disposition: A | Discharge status: Home / Self Care | Payer: 59 | Attending: Emergency Medicine | Admitting: Emergency Medicine

## 2022-01-29 DIAGNOSIS — R42 Dizziness and giddiness: Secondary | ICD-10-CM | POA: Diagnosis present

## 2022-01-29 DIAGNOSIS — Z79899 Other long term (current) drug therapy: Secondary | ICD-10-CM | POA: Insufficient documentation

## 2022-01-29 DIAGNOSIS — I10 Essential (primary) hypertension: Secondary | ICD-10-CM | POA: Diagnosis not present

## 2022-01-29 LAB — CBC
HCT: 41.3 % (ref 36.0–46.0)
Hemoglobin: 13 g/dL (ref 12.0–15.0)
MCH: 26.4 pg (ref 26.0–34.0)
MCHC: 31.5 g/dL (ref 30.0–36.0)
MCV: 83.8 fL (ref 80.0–100.0)
Platelets: 305 10*3/uL (ref 150–400)
RBC: 4.93 MIL/uL (ref 3.87–5.11)
RDW: 14.3 % (ref 11.5–15.5)
WBC: 8 10*3/uL (ref 4.0–10.5)
nRBC: 0 % (ref 0.0–0.2)

## 2022-01-29 LAB — BASIC METABOLIC PANEL
Anion gap: 8 (ref 5–15)
BUN: 15 mg/dL (ref 6–20)
CO2: 27 mmol/L (ref 22–32)
Calcium: 9.1 mg/dL (ref 8.9–10.3)
Chloride: 99 mmol/L (ref 98–111)
Creatinine, Ser: 0.73 mg/dL (ref 0.44–1.00)
GFR, Estimated: >60 mL/min (ref >60)
Glucose, Bld: 90 mg/dL (ref 70–99)
Potassium: 3.3 mmol/L — ABNORMAL LOW (ref 3.5–5.1)
Sodium: 134 mmol/L — ABNORMAL LOW (ref 135–145)

## 2022-01-29 LAB — TROPONIN I (HIGH SENSITIVITY)
Troponin I (High Sensitivity): 3 ng/L (ref <18)
Troponin I (High Sensitivity): 3 ng/L (ref <18)

## 2022-01-29 NOTE — ED Provider Notes (Signed)
? EMERGENCY DEPARTMENT ?Provider Note ? ? ?CSN: [id number] ?Arrival date & time: 01/29/22  1509 ? ?  ? ?History ? ?Chief Complaint  ?Patient presents with  ? Dizziness  ? ? ?Sheena Cain is a 56 y.o. female. ? ?Patient is started new blood pressure medicine and complains of dizziness today.  Past medical history of GERD ? ?The history is provided by the patient and medical records. No language interpreter was used.  ?Dizziness ?Quality:  Lightheadedness ?Severity:  Mild ?Onset quality:  Sudden ?Timing:  Intermittent ?Progression:  Waxing and waning ?Chronicity:  New ?Context: not when bending over   ?Relieved by:  Nothing ?Worsened by:  Nothing ?Ineffective treatments:  None tried ?Associated symptoms: no blood in stool, no chest pain, no diarrhea and no headaches   ? ?  ? ?Home Medications ?Prior to Admission medications   ?Medication Sig Start Date End Date Taking? Authorizing Provider  ?amLODipine (NORVASC) 5 MG tablet Take 1 tablet (5 mg total) by mouth daily. 01/27/22   Burgess Amor, PA-C  ?omeprazole (PRILOSEC OTC) 20 MG tablet Take 20 mg by mouth daily.    [provider]  ?   ? ?Allergies    ?Patient has no known allergies.   ? ?Review of Systems   ?Review of Systems  ?Constitutional:  Negative for appetite change and fatigue.  ?HENT:  Negative for congestion, ear discharge and sinus pressure.   ?Eyes:  Negative for discharge.  ?Respiratory:  Negative for cough.   ?Cardiovascular:  Negative for chest pain.  ?Gastrointestinal:  Negative for abdominal pain, blood in stool and diarrhea.  ?Genitourinary:  Negative for frequency and hematuria.  ?Musculoskeletal:  Negative for back pain.  ?Skin:  Negative for rash.  ?Neurological:  Positive for dizziness. Negative for seizures and headaches.  ?Psychiatric/Behavioral:  Negative for hallucinations.   ? ?Physical Exam ?Updated Vital Signs ?BP (!) 149/86   Pulse 85   Temp 98 ?F (36.7 ?C) (Oral)   Resp 18   Ht 5\' 6"  (1.676 m)   Wt 107 kg   SpO2  99%   BMI 38.07 kg/m?  ?Physical Exam ?Vitals and nursing note reviewed.  ?Constitutional:   ?   Appearance: She is well-developed.  ?HENT:  ?   Head: Normocephalic.  ?   Nose: Nose normal.  ?Eyes:  ?   General: No scleral icterus. ?   Conjunctiva/sclera: Conjunctivae normal.  ?Neck:  ?   Thyroid: No thyromegaly.  ?Cardiovascular:  ?   Rate and Rhythm: Normal rate and regular rhythm.  ?   Heart sounds: No murmur heard. ?  No friction rub. No gallop.  ?Pulmonary:  ?   Breath sounds: No stridor. No wheezing or rales.  ?Chest:  ?   Chest wall: No tenderness.  ?Abdominal:  ?   General: There is no distension.  ?   Tenderness: There is no abdominal tenderness. There is no rebound.  ?Musculoskeletal:     ?   General: Normal range of motion.  ?   Cervical back: Neck supple.  ?Lymphadenopathy:  ?   Cervical: No cervical adenopathy.  ?Skin: ?   Findings: No erythema or rash.  ?Neurological:  ?   Mental Status: She is alert and oriented to person, place, and time.  ?   Motor: No abnormal muscle tone.  ?   Coordination: Coordination normal.  ?Psychiatric:     ?   Behavior: Behavior normal.  ? ? ?ED Results / Procedures / Treatments   ?  Labs ?(all labs ordered are listed, but only abnormal results are displayed) ?Labs Reviewed  ?BASIC METABOLIC PANEL - Abnormal; Notable for the following components:  ?    Result Value  ? Sodium 134 (*)   ? Potassium 3.3 (*)   ? All other components within normal limits  ?CBC  ?POC URINE PREG, ED  ?TROPONIN I (HIGH SENSITIVITY)  ?TROPONIN I (HIGH SENSITIVITY)  ? ? ?EKG ?EKG Interpretation ? ?Date/Time:  Wednesday January 29 2022 15:38:02 EDT ?Ventricular Rate:  78 ?PR Interval:  166 ?QRS Duration: 88 ?QT Interval:  374 ?QTC Calculation: 426 ?R Axis:   64 ?Text Interpretation: Normal sinus rhythm Nonspecific T wave abnormality Abnormal ECG When compared with ECG of 27-Jan-2022 18:07, No significant change was found Confirmed by Bethann Berkshire [phone]) on 01/29/2022 8:29:05 PM ? ?Radiology ?DG Chest 2  View ? ?Result Date: 01/29/2022 ?CLINICAL DATA:  Chest pain, shortness of breath EXAM: CHEST - 2 VIEW COMPARISON:  01/27/2022 FINDINGS: The heart size and mediastinal contours are within normal limits. Both lungs are clear. The visualized skeletal structures are unremarkable. IMPRESSION: No active cardiopulmonary disease. Electronically Signed   By: Elige Ko M.D.   On: 01/29/2022 15:59   ? ?Procedures ?Procedures  ? ? ?Medications Ordered in ED ?Medications - No data to display ? ?ED Course/ Medical Decision Making/ A&P ?  ?                        ?Medical Decision Making ?Amount and/or Complexity of Data Reviewed ?Labs: ordered. ?Radiology: ordered. ? ?This patient presents to the ED for concern of dizziness, this involves an extensive number of treatment options, and is a complaint that carries with it a high risk of complications and morbidity.  The differential diagnosis includes vertigo, hypertension, reaction to medicine ? ? ?Co morbidities that complicate the patient evaluation ? ?Hypertension ? ? ?Additional history obtained: ? ?Additional history obtained from patient ?External records from outside source obtained and reviewed including hospital records ? ? ?Lab Tests: ? ?I Ordered, and personally interpreted labs.  The pertinent results include: CBC and chemistries unremarkable ? ? ?Imaging Studies ordered: ? ?I ordered imaging studies including chest x-ray ?I independently visualized and interpreted imaging which showed negative ?I agree with the radiologist interpretation ? ? ?Cardiac Monitoring: / EKG: ? ?The patient was maintained on a cardiac monitor.  I personally viewed and interpreted the cardiac monitored which showed an underlying rhythm of: Normal sinus rhythm ? ? ?Consultations Obtained: ? ?No consult ? ?Problem List / ED Course / Critical interventions / Medication management ? ?Hypertension dizziness ?No medicines ordered ?Reevaluation of the patient after these medicines showed that the  patient stayed the same ?I have reviewed the patients home medicines and have made adjustments as needed ? ? ?Social Determinants of Health: ? ?None ? ? ?Test / Admission - Considered: ? ?None ? ?Patient with hypertension and dizziness.  She will stop her blood pressure medicine for tonight and follow-up with her PCP tomorrow ? ? ? ? ? ? ? ?Final Clinical Impression(s) / ED Diagnoses ?Final diagnoses:  ?Dizziness  ?Primary hypertension  ? ? ?Rx / DC Orders ?ED Discharge Orders   ? ? None  ? ?  ? ? ?  ?Bethann Berkshire, MD ?01/31/22 1017 ? ?

## 2022-01-29 NOTE — ED Notes (Signed)
Pt states that dizziness comes and goes. Says that it is the worst when standing, pt orthostatics show high change in BP when pt standing compared to lying and sitting. ?

## 2022-01-29 NOTE — ED Triage Notes (Signed)
Pt states she was seen here Monday for same complaint and states she is not feeling any better; pt c/o some chest tightness ?

## 2022-01-29 NOTE — Discharge Instructions (Signed)
Do not take your blood pressure medicine tonight.  And follow-up with your doctor tomorrow as planned ?

## 2022-03-05 MED ORDER — PROPOFOL 1000 MG/100ML IV EMUL
INTRAVENOUS | Status: AC
  Filled 2022-03-05: qty 200

## 2022-03-05 MED ORDER — LIDOCAINE HCL (PF) 2 % IJ SOLN
INTRAMUSCULAR | Status: AC
  Filled 2022-03-05: qty 10

## 2022-05-31 IMAGING — CT CT HEAD W/O CM
3 of 4 series · 15 of 47 positions shown, 18 images · non-contrast
Comparison: 07/15/2018

CLINICAL DATA: Headaches and dizziness for 2 days, initial
encounter



[Series 2: head w o · axial · 0.41mm/px · z∈[+25,+145]mm · 9 of 30 slices shown, 12 images]
[im 3/30  brain]
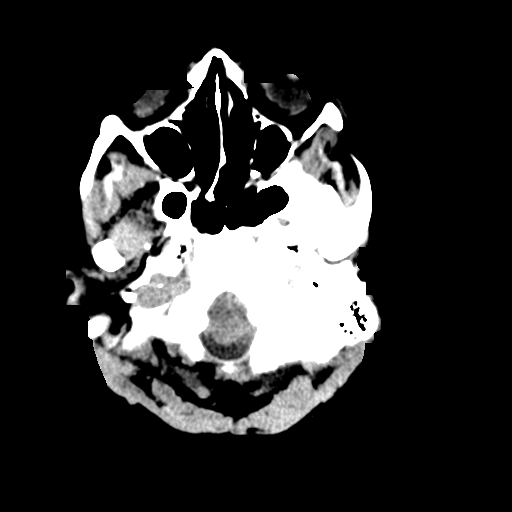
[im 3/30  bone]
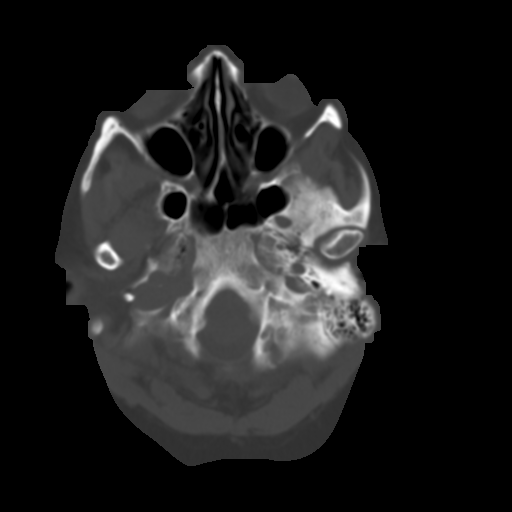
[im 7/30  brain]
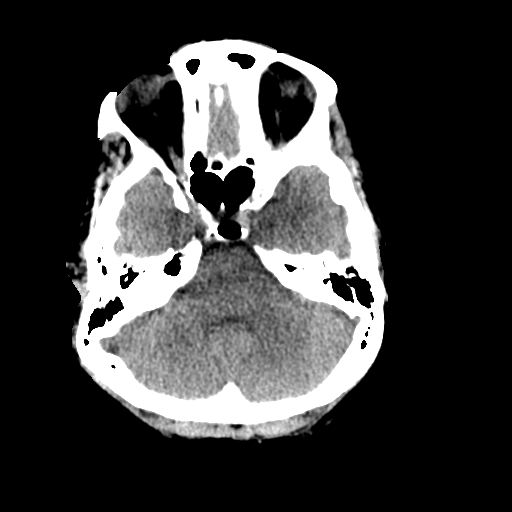
[im 9/30  brain]
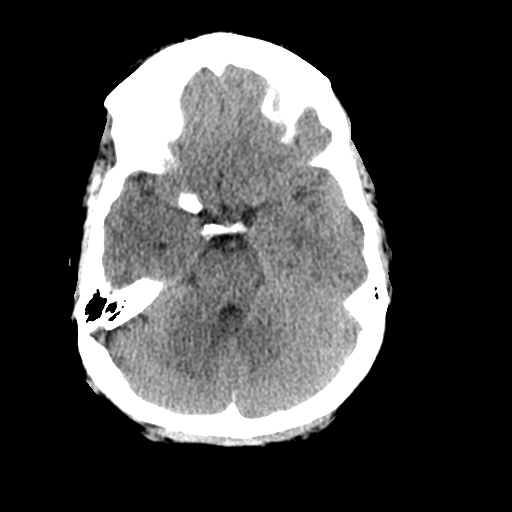
[im 13/30  brain]
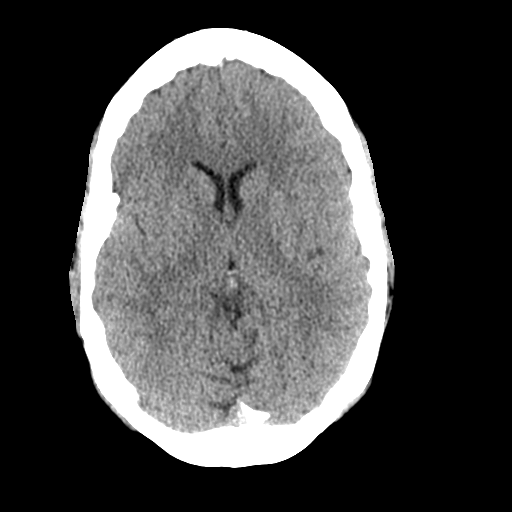
[im 15/30  brain]
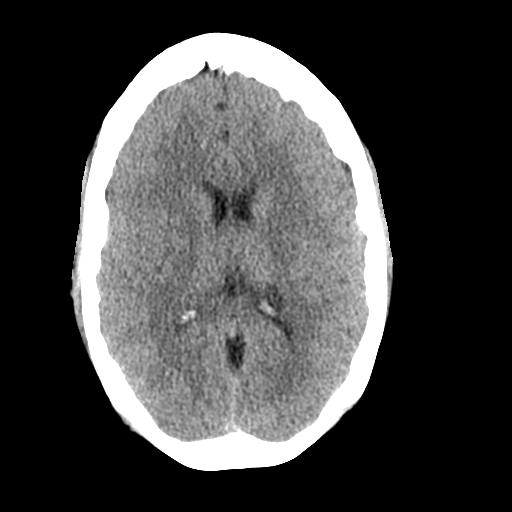
[im 15/30  bone]
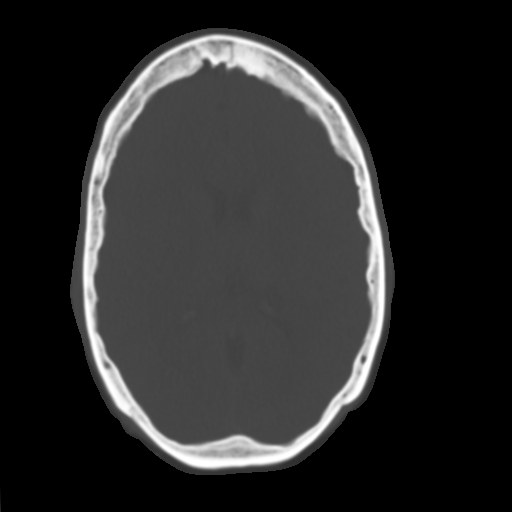
[im 17/30  brain]
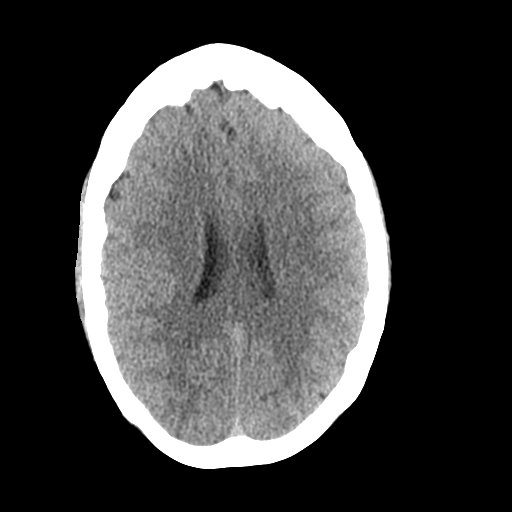
[im 21/30  brain]
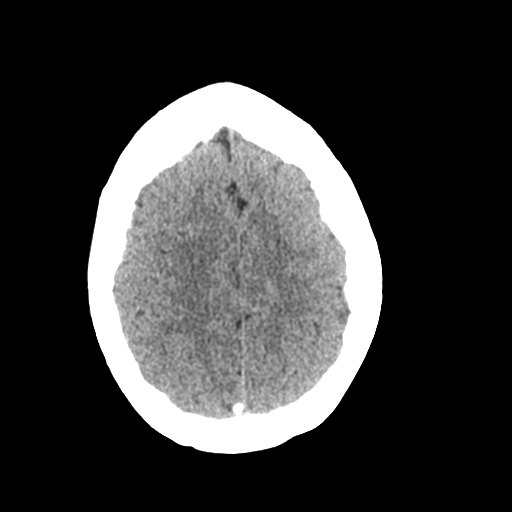
[im 23/30  brain]
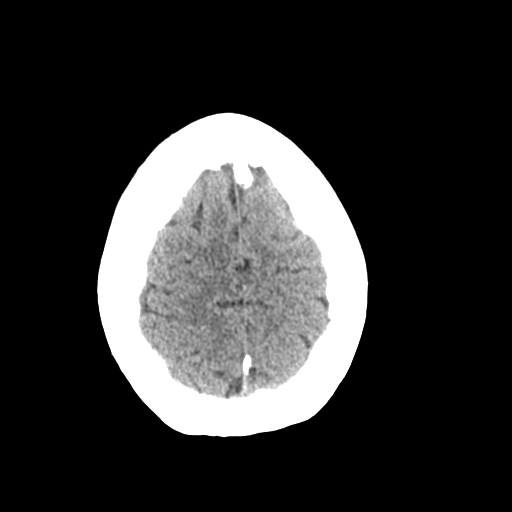
[im 27/30  brain]
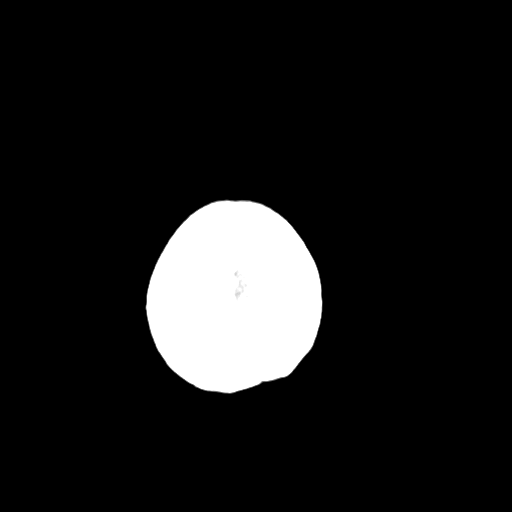
[im 27/30  bone]
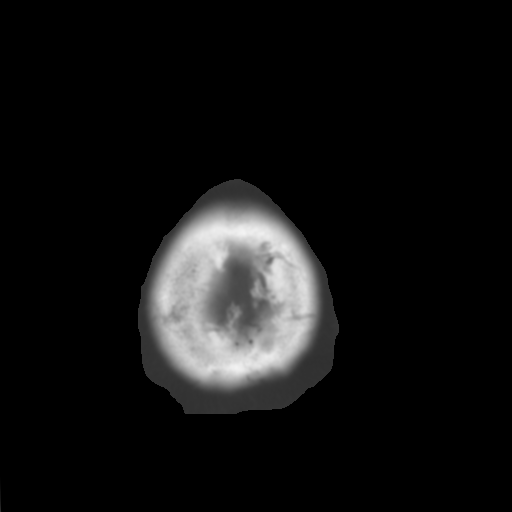

[Series 4: coronal soft · coronal · 0.30mm/px · 3 of 67 slices shown]
[im 23/67  brain]
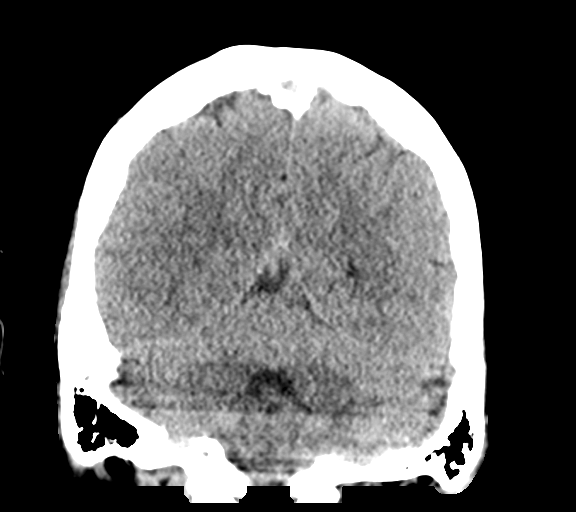
[im 30/67  brain]
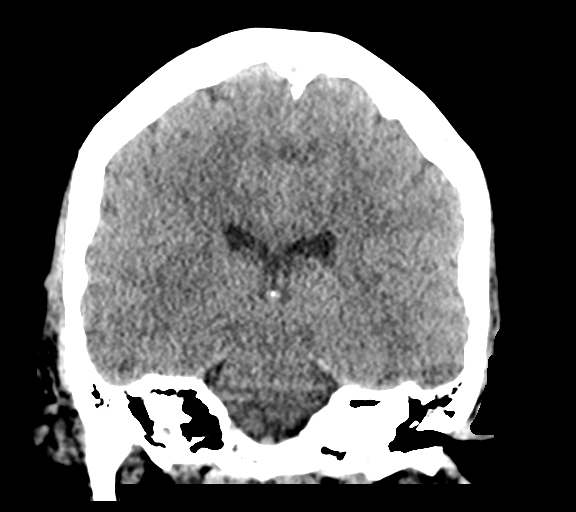
[im 37/67  brain]
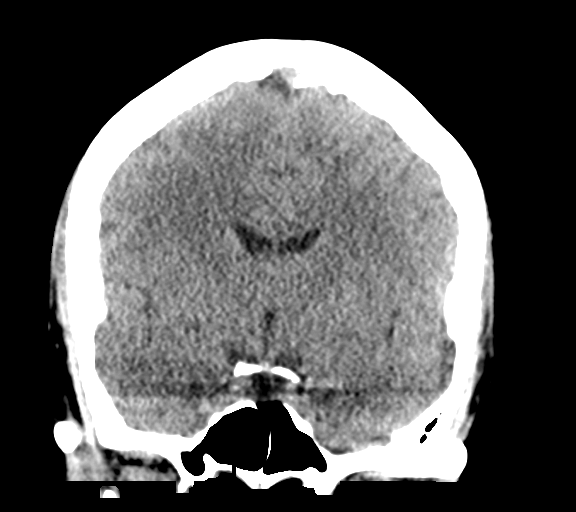

[Series 5: sagittal soft · sagittal · 0.31mm/px · 3 of 56 slices shown]
[im 19/56  brain]
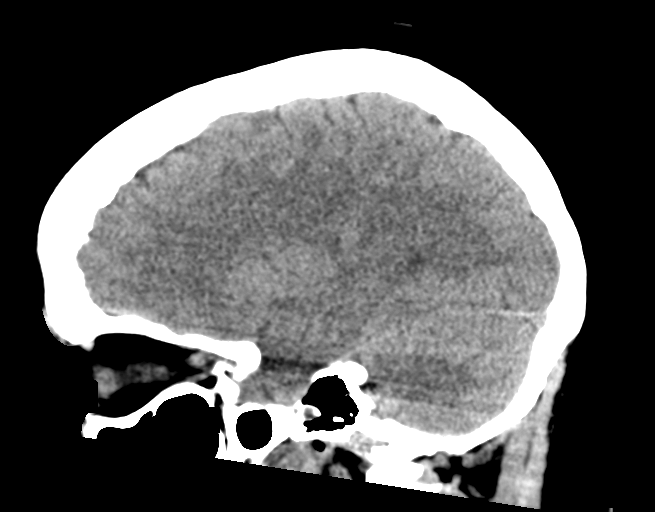
[im 28/56  brain]
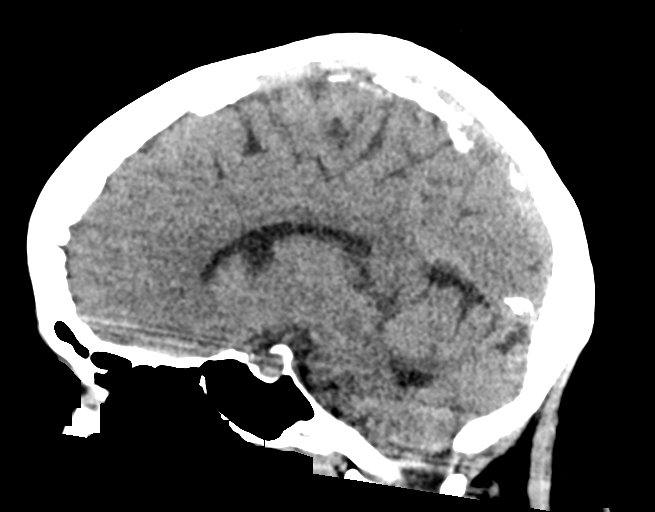
[im 37/56  brain]
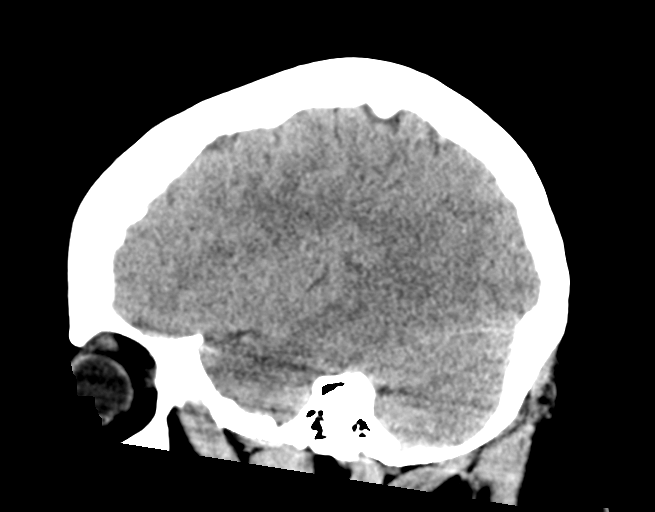

[15 of 47 positions shown; findings below may reference images not displayed]

FINDINGS: Brain: No evidence of acute infarction, hemorrhage, hydrocephalus,
extra-axial collection or mass lesion/mass effect.

Vascular: No hyperdense vessel or unexpected calcification.

Skull: Normal. Negative for fracture or focal lesion.

Sinuses/Orbits: No acute finding.

Other: None.
IMPRESSION: No acute intracranial abnormality noted.

## 2022-06-02 IMAGING — DX DG CHEST 2V
2 series · 2 of 2 positions shown · non-contrast
Comparison: 01/27/2022

CLINICAL DATA: Chest pain, shortness of breath

EXAM:
CHEST - 2 VIEW

[chest pa]
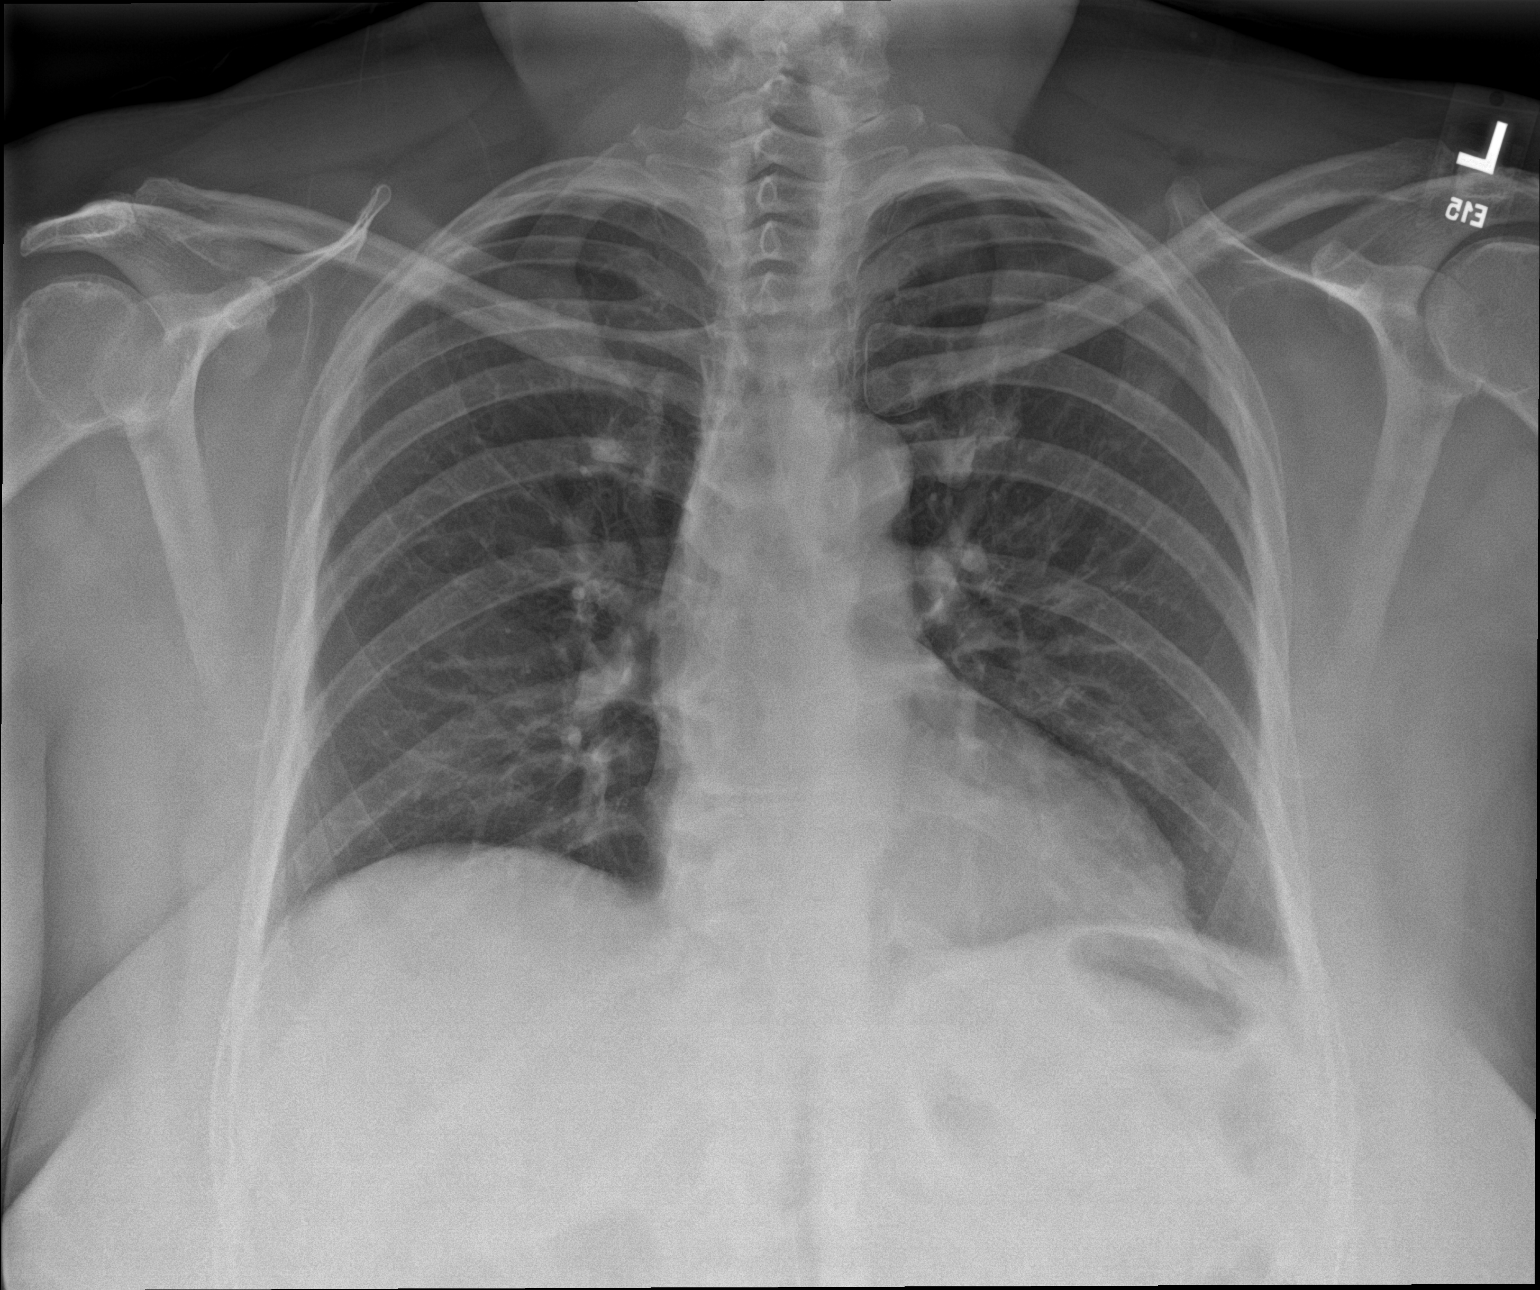

[chest lat]
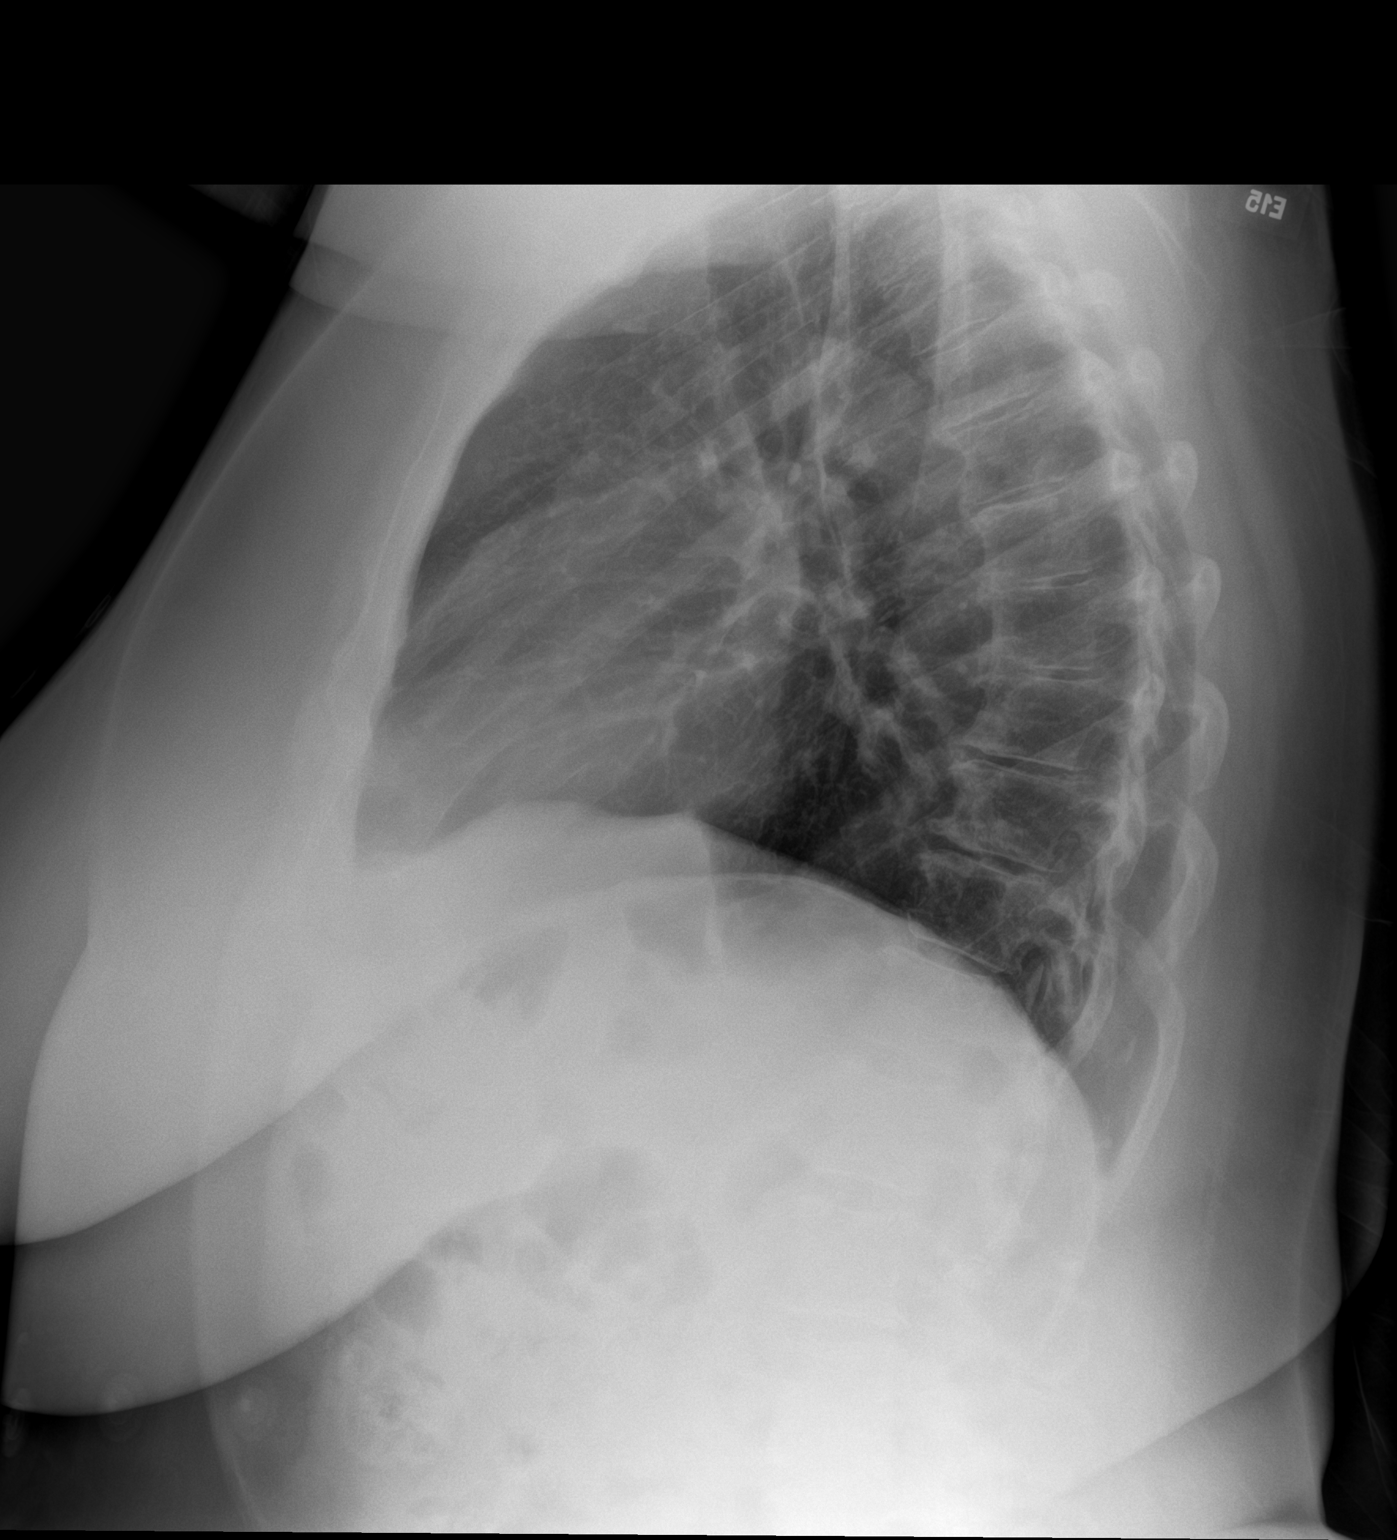

[2 of 2 positions shown; findings below may reference images not displayed]

FINDINGS: The heart size and mediastinal contours are within normal limits.
Both lungs are clear. The visualized skeletal structures are
unremarkable.
IMPRESSION: No active cardiopulmonary disease.

## 2023-02-24 ENCOUNTER — Emergency Department (HOSPITAL_COMMUNITY)
Admission: EM | Admit: 2023-02-24 | Discharge: 2023-02-24 | Disposition: A | Discharge status: Home / Self Care | Payer: 59 | Attending: Emergency Medicine | Admitting: Emergency Medicine

## 2023-02-24 ENCOUNTER — Other Ambulatory Visit: Payer: Self-pay

## 2023-02-24 ENCOUNTER — Emergency Department (HOSPITAL_COMMUNITY): Payer: 59

## 2023-02-24 ENCOUNTER — Encounter (HOSPITAL_COMMUNITY): Payer: Self-pay | Admitting: Emergency Medicine

## 2023-02-24 DIAGNOSIS — Z20822 Contact with and (suspected) exposure to covid-19: Secondary | ICD-10-CM | POA: Diagnosis not present

## 2023-02-24 DIAGNOSIS — R197 Diarrhea, unspecified: Secondary | ICD-10-CM | POA: Diagnosis not present

## 2023-02-24 DIAGNOSIS — R059 Cough, unspecified: Secondary | ICD-10-CM | POA: Diagnosis not present

## 2023-02-24 DIAGNOSIS — R112 Nausea with vomiting, unspecified: Secondary | ICD-10-CM

## 2023-02-24 DIAGNOSIS — I10 Essential (primary) hypertension: Secondary | ICD-10-CM | POA: Insufficient documentation

## 2023-02-24 DIAGNOSIS — Z79899 Other long term (current) drug therapy: Secondary | ICD-10-CM | POA: Diagnosis not present

## 2023-02-24 DIAGNOSIS — R10816 Epigastric abdominal tenderness: Secondary | ICD-10-CM | POA: Diagnosis not present

## 2023-02-24 LAB — COMPREHENSIVE METABOLIC PANEL
ALT: 14 U/L (ref 0–44)
AST: 14 U/L — ABNORMAL LOW (ref 15–41)
Albumin: 3.4 g/dL — ABNORMAL LOW (ref 3.5–5.0)
Alkaline Phosphatase: 47 U/L (ref 38–126)
Anion gap: 8 (ref 5–15)
BUN: 12 mg/dL (ref 6–20)
CO2: 27 mmol/L (ref 22–32)
Calcium: 8.9 mg/dL (ref 8.9–10.3)
Chloride: 103 mmol/L (ref 98–111)
Creatinine, Ser: 0.87 mg/dL (ref 0.44–1.00)
GFR, Estimated: >60 mL/min (ref >60)
Glucose, Bld: 157 mg/dL — ABNORMAL HIGH (ref 70–99)
Potassium: 3.2 mmol/L — ABNORMAL LOW (ref 3.5–5.1)
Sodium: 138 mmol/L (ref 135–145)
Total Bilirubin: 0.5 mg/dL (ref 0.3–1.2)
Total Protein: 7 g/dL (ref 6.5–8.1)

## 2023-02-24 LAB — CBC
HCT: 38.1 % (ref 36.0–46.0)
Hemoglobin: 12.1 g/dL (ref 12.0–15.0)
MCH: 25.9 pg — ABNORMAL LOW (ref 26.0–34.0)
MCHC: 31.8 g/dL (ref 30.0–36.0)
MCV: 81.6 fL (ref 80.0–100.0)
Platelets: 256 10*3/uL (ref 150–400)
RBC: 4.67 MIL/uL (ref 3.87–5.11)
RDW: 14.5 % (ref 11.5–15.5)
WBC: 7.5 10*3/uL (ref 4.0–10.5)
nRBC: 0 % (ref 0.0–0.2)

## 2023-02-24 LAB — SARS CORONAVIRUS 2 BY RT PCR: SARS Coronavirus 2 by RT PCR: NEGATIVE

## 2023-02-24 LAB — TROPONIN I (HIGH SENSITIVITY): Troponin I (High Sensitivity): 4 ng/L (ref <18)

## 2023-02-24 LAB — MAGNESIUM: Magnesium: 2.1 mg/dL (ref 1.7–2.4)

## 2023-02-24 LAB — LIPASE, BLOOD: Lipase: 25 U/L (ref 11–51)

## 2023-02-24 MED ORDER — KETOROLAC TROMETHAMINE 15 MG/ML IJ SOLN
15.0000 mg | Freq: Once | INTRAMUSCULAR | Status: AC
  Administered 2023-02-24: 15 mg via INTRAVENOUS
  Filled 2023-02-24: qty 1

## 2023-02-24 MED ORDER — SODIUM CHLORIDE 0.9 % IV BOLUS
1000.0000 mL | Freq: Once | INTRAVENOUS | Status: AC
  Administered 2023-02-24: 1000 mL via INTRAVENOUS

## 2023-02-24 MED ORDER — AMOXICILLIN 500 MG PO CAPS: 1000.0000 mg | ORAL_CAPSULE | Freq: Three times a day (TID) | ORAL | 0 refills | Status: AC

## 2023-02-24 MED ORDER — ONDANSETRON HCL 4 MG PO TABS
4.0000 mg | ORAL_TABLET | Freq: Four times a day (QID) | ORAL | 0 refills | Status: DC | PRN
Start: 2023-02-24 — End: 2024-06-20

## 2023-02-24 MED ORDER — POTASSIUM CHLORIDE CRYS ER 20 MEQ PO TBCR
40.0000 meq | EXTENDED_RELEASE_TABLET | Freq: Once | ORAL | Status: AC
Start: 2023-02-24 — End: 2023-02-24
  Administered 2023-02-24: 40 meq via ORAL
  Filled 2023-02-24: qty 2

## 2023-02-24 MED ORDER — ONDANSETRON HCL 4 MG/2ML IJ SOLN
4.0000 mg | Freq: Once | INTRAMUSCULAR | Status: AC
  Administered 2023-02-24: 4 mg via INTRAVENOUS
  Filled 2023-02-24: qty 2

## 2023-02-24 NOTE — ED Notes (Signed)
Pt given water for PO challenge, denies vomiting

## 2023-02-24 NOTE — ED Provider Notes (Signed)
[facility]ERG[facility] DEPARTMENT AT [facility] Provider Note   CSN: [id number] Arrival date & time: 02/24/23  1021     History  Chief Complaint  Patient presents with   Abdominal Pain   Chest Pain   Emesis   Nausea   Weakness    Sheena Cain is a 57 y.o. female.   Abdominal Pain Associated symptoms: chest pain and vomiting   Chest Pain Associated symptoms: abdominal pain, vomiting and weakness   Emesis Associated symptoms: abdominal pain   Weakness Associated symptoms: abdominal pain, chest pain and vomiting     57 year old female presents emergency department with complaints of nausea, vomiting, epigastric abdominal pain, diarrhea, cough, nasal congestion, headache.  Patient states that she has been with symptoms since Friday of last week.  Patient with 3-4 known COVID exposures at work.  States symptoms developed gradually and worsened since onset.  Patient reports feelings of nausea with emesis 5-6 times in the past 24 hours as well as 4 loose bowel movements in the past 24 hours.  States that pain is located in epigastric region without radiation.  States that pain is worsened with bouts of emesis and seems to be relieved when not vomiting.  Patient also reports some centralized chest pain worsened with coughing.  Chest pain is not worsened with exertion or taking a deep breath.  Patient reports fever at home Saturday but has been afebrile since without antipyretic use.  Patient also reports right frontal headache occurring gradually over the past 1 to 2 days.  Denies any visual disturbance, gait abnormality, slurred speech, facial droop, weakness/sensory deficits in upper or lower extremities.  Denies shortness of breath, urinary symptoms, vaginal symptoms, hematemesis, hematochezia/melena.  Past medical history significant for hypertension, migraine, OSA, degenerative disc disease  Home Medications Prior to Admission medications   Medication Sig Start Date End  Date Taking? Authorizing Provider  amoxicillin (AMOXIL) 500 MG capsule Take 2 capsules (1,000 mg total) by mouth 3 (three) times daily for 5 days. 02/24/23 03/01/23 Yes Sherian Maroon A, PA  ondansetron (ZOFRAN) 4 MG tablet Take 1 tablet (4 mg total) by mouth every 6 (six) hours as needed for nausea or vomiting. 02/24/23  Yes Sherian Maroon A, PA  amLODipine (NORVASC) 5 MG tablet Take 1 tablet (5 mg total) by mouth daily. 01/27/22   Burgess Amor, PA-C  omeprazole (PRILOSEC OTC) 20 MG tablet Take 20 mg by mouth daily.    [provider]      Allergies    Patient has no known allergies.    Review of Systems   Review of Systems  Cardiovascular:  Positive for chest pain.  Gastrointestinal:  Positive for abdominal pain and vomiting.  Neurological:  Positive for weakness.  All other systems reviewed and are negative.   Physical Exam Updated Vital Signs BP 130/85   Pulse 83   Temp 98.1 F (36.7 C) (Oral)   Resp (!) 21   SpO2 96%  Physical Exam Vitals and nursing note reviewed.  Constitutional:      General: She is not in acute distress.    Appearance: She is well-developed.  HENT:     Head: Normocephalic and atraumatic.     Right Ear: Tympanic membrane, ear canal and external ear normal.     Left Ear: Tympanic membrane, ear canal and external ear normal.  Eyes:     Conjunctiva/sclera: Conjunctivae normal.  Neck:     Comments: Kernig and Brudzinski negative. Cardiovascular:  Rate and Rhythm: Normal rate and regular rhythm.     Heart sounds: No murmur heard. Pulmonary:     Effort: Pulmonary effort is normal. No respiratory distress.     Breath sounds: Rales present. No wheezing.     Comments: Patient with faint Rales auscultated bilateral lung fields with left greater than right. Abdominal:     Palpations: Abdomen is soft.     Comments: Very mild epigastric tenderness to palpation.  Musculoskeletal:        General: No swelling.     Cervical back: Neck supple. No  rigidity.     Right lower leg: No edema.     Left lower leg: No edema.  Skin:    General: Skin is warm and dry.     Capillary Refill: Capillary refill takes less than 2 seconds.  Neurological:     Mental Status: She is alert.     Comments: Alert and oriented to self, place, time and event.   Speech is fluent, clear without dysarthria or dysphasia.   Strength 5/5 in upper/lower extremities   Sensation intact in upper/lower extremities   Normal gait.  CN I not tested  CN II not tested CN III, IV, VI PERRLA and EOMs intact bilaterally  CN V Intact sensation to sharp and light touch to the face  CN VII facial movements symmetric  CN VIII not tested  CN IX, X no uvula deviation, symmetric rise of soft palate  CN XI 5/5 SCM and trapezius strength bilaterally  CN XII Midline tongue protrusion, symmetric L/R movements     Psychiatric:        Mood and Affect: Mood normal.     ED Results / Procedures / Treatments   Labs (all labs ordered are listed, but only abnormal results are displayed) Labs Reviewed  COMPREHENSIVE METABOLIC PANEL - Abnormal; Notable for the following components:      Result Value   Potassium 3.2 (*)    Glucose, Bld 157 (*)    Albumin 3.4 (*)    AST 14 (*)    All other components within normal limits  CBC - Abnormal; Notable for the following components:   MCH 25.9 (*)    All other components within normal limits  SARS CORONAVIRUS 2 BY RT PCR  LIPASE, BLOOD  MAGNESIUM  TROPONIN I (HIGH SENSITIVITY)    EKG EKG Interpretation  Date/Time:  Tuesday Feb 24 2023 12:43:29 EDT Ventricular Rate:  73 PR Interval:  166 QRS Duration: 95 QT Interval:  415 QTC Calculation: 458 R Axis:   53 Text Interpretation: Sinus rhythm T wave abnormality No significant change since last tracing Abnormal ECG Confirmed by Gerhard Munch [phone]) on 02/24/2023 12:52:28 PM  Radiology DG Chest Port 1 View  Result Date: 02/24/2023 CLINICAL DATA:  Cough EXAM: PORTABLE CHEST 1  VIEW COMPARISON:  Radiograph 01/29/2022 FINDINGS: Unchanged cardiomediastinal silhouette. There is no focal airspace consolidation. No pleural effusion or evidence of pneumothorax. No acute osseous abnormality. IMPRESSION: No evidence of acute cardiopulmonary disease. Electronically Signed   By: Caprice Renshaw M.D.   On: 02/24/2023 11:12    Procedures Procedures    Medications Ordered in ED Medications  sodium chloride 0.9 % bolus 1,000 mL (0 mLs Intravenous Stopped 02/24/23 1222)  ondansetron (ZOFRAN) injection 4 mg (4 mg Intravenous Given 02/24/23 1116)  ketorolac (TORADOL) 15 MG/ML injection 15 mg (15 mg Intravenous Given 02/24/23 1115)  potassium chloride SA (KLOR-CON M) CR tablet 40 mEq (40 mEq Oral Given 02/24/23  1221)    ED Course/ Medical Decision Making/ A&P                             Medical Decision Making Amount and/or Complexity of Data Reviewed Labs: ordered. Radiology: ordered.  Risk Prescription drug management.   This patient presents to the ED for concern of nausea, vomiting, cough, this involves an extensive number of treatment options, and is a complaint that carries with it a high risk of complications and morbidity.  The differential diagnosis includes viral URI, COVID, influenza, RSV, gastroenteritis, meningitis, sepsis   Co morbidities that complicate the patient evaluation  See HPI   Additional history obtained:  Additional history obtained from EMR External records from outside source obtained and reviewed including hospital records   Lab Tests:  I Ordered, and personally interpreted labs.  The pertinent results include: No leukocytosis noted.  No evidence of anemia.  Placed within range.  Mild hypokalemia of 3.2 supplemented orally while emergency department but otherwise without electrolyte abnormality.  No transaminitis.  No renal dysfunction.  Lipase within normal limits.  COVID test negative.  Magnesium within normal limits.  Troponin of 4; second  troponin deemed unnecessary given duration of patient's symptoms.   Imaging Studies ordered:  I ordered imaging studies including chest x-ray I independently visualized and interpreted imaging which showed no acute cardiopulmonary abnormalities I agree with the radiologist interpretation   Cardiac Monitoring: / EKG:  The patient was maintained on a cardiac monitor.  I personally viewed and interpreted the cardiac monitored which showed an underlying rhythm of: Sinus rhythm with nonspecific T wave change; no obvious acute abnormality from prior EKGs performed.   Consultations Obtained:  N/a   Problem List / ED Course / Critical interventions / Medication management  Nausea, vomiting, diarrhea, cough I ordered medication including Toradol, 1 L normal saline, potassium chloride, Zofran   Reevaluation of the patient after these medicines showed that the patient improved I have reviewed the patients home medicines and have made adjustments as needed   Social Determinants of Health:  Denies tobacco, illicit drug use   Test / Admission - Considered:  Nausea, vomiting, diarrhea, cough Vitals signs  within normal range and stable throughout visit. Laboratory/imaging studies significant for: See above 57 year old female presents emergency department with complaints of nausea, vomiting, diarrhea, cough.  Patient workup overall reassuring.  On exam, patient without abdominal tenderness so CT imaging of the abdomen was not pursued.  Regarding patient's chest pain, seem to be related to episodes of coughing without worsening with exertion or at baseline.  Troponin was pursued which was negative so low suspicion for ACS/myocarditis.  Patient without evidence of pneumonia on chest x-ray but with some appreciable rales with left greater than right lower lung fields so we will treat empirically for pneumonia given duration of patient's symptoms.  Patient's symptoms largely most consistent with  viral infection.  Will treat patient's symptoms with antiemetic as well as antidiarrheal as needed for persistent tachycardia.  Patient overall well-appearing, afebrile in no acute distress, without leukocytosis, tolerating p.o. without difficulty after administration of antiemetic.  Patient recommended follow-up with primary care for reassessment of symptoms.  Treatment plan discussed at length with patient and she acknowledged understanding was agreeable to said plan. Worrisome signs and symptoms were discussed with the patient, and the patient acknowledged understanding to return to the ED if noticed. Patient was stable upon discharge.  Final Clinical Impression(s) / ED Diagnoses Final diagnoses:  Nausea vomiting and diarrhea  Cough, unspecified type    Rx / DC Orders ED Discharge Orders          Ordered    ondansetron (ZOFRAN) 4 MG tablet  Every 6 hours PRN        02/24/23 1330    amoxicillin (AMOXIL) 500 MG capsule  3 times daily        02/24/23 1330              Peter Garter, [place] 02/24/23 1552    Gerhard Munch, MD 02/25/23 1650

## 2023-02-24 NOTE — Discharge Instructions (Addendum)
As discussed, workup today overall reassuring.  Given her new lung sounds, will treat for pneumonia empirically.  Recommend bland diet over the next few days until you are able to tolerate more complex foods.  Will also send medication to use as needed for nausea called Zofran.  You may find over-the-counter Imodium beneficial for persistent/excessive diarrhea.  Recommend follow-up with primary care for reassessment of your symptoms.  Please do not hesitate to return to emergency department for worrisome signs and symptoms we discussed become apparent.

## 2023-02-24 NOTE — ED Triage Notes (Signed)
Pt c/o headache, abd pain, n/v/d, right ear pain, pain to mid upper chest intermittent x 4 days. C/o gen weakness. N/v x 6 and diarrhea x 4 in last 24 hoursPt a/o. Color wnl. Non diaphoretic. Mm wet.

## 2024-02-23 ENCOUNTER — Other Ambulatory Visit: Payer: Self-pay | Admitting: Family Medicine

## 2024-02-23 DIAGNOSIS — Z1231 Encounter for screening mammogram for malignant neoplasm of breast: Secondary | ICD-10-CM

## 2024-06-20 ENCOUNTER — Emergency Department (HOSPITAL_COMMUNITY)
Admission: EM | Admit: 2024-06-20 | Discharge: 2024-06-20 | Disposition: A | Discharge status: Home / Self Care | Attending: Emergency Medicine | Admitting: Emergency Medicine

## 2024-06-20 ENCOUNTER — Encounter (HOSPITAL_COMMUNITY): Payer: Self-pay | Admitting: Emergency Medicine

## 2024-06-20 ENCOUNTER — Other Ambulatory Visit: Payer: Self-pay

## 2024-06-20 DIAGNOSIS — I10 Essential (primary) hypertension: Secondary | ICD-10-CM | POA: Insufficient documentation

## 2024-06-20 DIAGNOSIS — Z79899 Other long term (current) drug therapy: Secondary | ICD-10-CM | POA: Insufficient documentation

## 2024-06-20 DIAGNOSIS — H66001 Acute suppurative otitis media without spontaneous rupture of ear drum, right ear: Secondary | ICD-10-CM | POA: Diagnosis not present

## 2024-06-20 DIAGNOSIS — R051 Acute cough: Secondary | ICD-10-CM | POA: Insufficient documentation

## 2024-06-20 DIAGNOSIS — H9201 Otalgia, right ear: Secondary | ICD-10-CM | POA: Diagnosis present

## 2024-06-20 LAB — RESP PANEL BY RT-PCR (RSV, FLU A&B, COVID)  RVPGX2
Influenza A by PCR: NEGATIVE
Influenza B by PCR: NEGATIVE
Resp Syncytial Virus by PCR: NEGATIVE
SARS Coronavirus 2 by RT PCR: NEGATIVE

## 2024-06-20 MED ORDER — AMOXICILLIN 500 MG PO CAPS: 1000.0000 mg | ORAL_CAPSULE | Freq: Three times a day (TID) | ORAL | 0 refills | Status: AC

## 2024-06-20 MED ORDER — ALBUTEROL SULFATE HFA 108 (90 BASE) MCG/ACT IN AERS
2.0000 | INHALATION_SPRAY | Freq: Once | RESPIRATORY_TRACT | Status: AC
  Administered 2024-06-20: 2 via RESPIRATORY_TRACT
  Filled 2024-06-20: qty 6.7

## 2024-06-20 NOTE — ED Triage Notes (Signed)
 Pt with c/o cough and sneezing. States she thinks she has a touch of pneumonia. Pt also believes she has pink eye d/t pain, redness, and drainage in both eyes, and also believes she has an ear infection in R ear d/t pain.

## 2024-06-20 NOTE — ED Provider Notes (Signed)
 [place] EMERGENCY DEPARTMENT AT [facility] Provider Note   CSN: [id number] Arrival date & time: 06/20/24  9565     Patient presents with: No chief complaint on file.   Sheena Cain is a 58 y.o. female.   The history is provided by the patient.  Patient w/history of hypertension and obesity presents for multiple complaints.  Patient reports around a week ago she began coughing and sneezing.  She then developed a fever.  She had a negative COVID test at her work.  Since then she has had increasing coughing, sneezing now having right ear pain.  She also reports bilateral eye pain and drainage.  She reports mild dizziness.  She has occasional posttussive emesis.  No active chest pain or shortness of breath She is a non-smoker.  No history of chronic lung disease. Last recorded fever was well over 24 hours ago   Past Medical History:  Diagnosis Date   Degenerative disc disease, cervical    Depression    Hypertension    Irregular heart beat    Migraines    Sleep apnea     Prior to Admission medications   Medication Sig Start Date End Date Taking? Authorizing Provider  amoxicillin  (AMOXIL ) 500 MG capsule Take 2 capsules (1,000 mg total) by mouth 3 (three) times daily. 06/20/24  Yes Midge Golas, MD  amLODipine  (NORVASC ) 5 MG tablet Take 1 tablet (5 mg total) by mouth daily. 01/27/22   Idol, Julie, PA-C  omeprazole (PRILOSEC OTC) 20 MG tablet Take 20 mg by mouth daily.    [provider]    Allergies: Patient has no known allergies.    Review of Systems  Constitutional:  Positive for fatigue and fever.  HENT:  Positive for ear pain and sneezing.   Respiratory:  Positive for cough.   Cardiovascular:  Negative for chest pain.    Updated Vital Signs BP (!) 143/93   Pulse 80   Temp 99.1 F (37.3 C) (Oral)   Resp 18   Ht 1.676 m (5' 6)   Wt 123.8 kg   SpO2 95%   BMI 44.06 kg/m   Physical Exam CONSTITUTIONAL: Well developed/well nourished HEAD:  Normocephalic/atraumatic EYES: EOMI/PERRL, no eye discharge, no proptosis, minimal conjunctival erythema noted ENMT: Mucous membranes moist, uvula midline no erythema or exudates, no stridor, no drooling Left TM is intact and clear.  Right TM is erythematous and bulging.  Ears are symmetric NECK: supple no meningeal signs CV: S1/S2 noted, no murmurs/rubs/gallops noted LUNGS: Lungs are clear to auscultation bilaterally, no apparent distress, coughs frequently during exam ABDOMEN: soft, nontender NEURO: Pt is awake/alert/appropriate, moves all extremitiesx4.  No facial droop.   EXTREMITIES: full ROM SKIN: warm, color normal  (all labs ordered are listed, but only abnormal results are displayed) Labs Reviewed  RESP PANEL BY RT-PCR (RSV, FLU A&B, COVID)  [id number]    EKG: None  Radiology: No results found.   Procedures   Medications Ordered in the ED  albuterol  (VENTOLIN  HFA) 108 (90 Base) MCG/ACT inhaler 2 puff (has no administration in time range)                                    Medical Decision Making Risk Prescription drug management.   Patient presents with symptoms of upper respiratory infection for the past week. Last fever was well over 24 hours ago.  She reports persistent nonproductive cough  and right ear pain.  Patient has evidence of otitis media.  Overall her lungs are clear, no wheezing or crackles, but persistent cough.  Patient would benefit from albuterol  and can also take OTC antitussives  No signs of bacterial conjunctivitis  No indication for chest x-ray at this time, no hypoxia and no added lung sounds  Patient is safe for discharge home     Final diagnoses:  Non-recurrent acute suppurative otitis media of right ear without spontaneous rupture of tympanic membrane  Acute cough    ED Discharge Orders          Ordered    amoxicillin  (AMOXIL ) 500 MG capsule  3 times daily        06/20/24 0515               Midge Golas,  MD 06/20/24 [phone]
